# Patient Record
Sex: Female | Born: 1935 | Race: White | Hispanic: No | Smoking: Former smoker
Health system: Southern US, Community
[De-identification: ages and names within clinical notes are randomized; demographics above are authoritative.]

## PROBLEM LIST (undated history)

## (undated) DIAGNOSIS — D1724 Benign lipomatous neoplasm of skin and subcutaneous tissue of left leg: Secondary | ICD-10-CM

## (undated) DIAGNOSIS — F329 Major depressive disorder, single episode, unspecified: Secondary | ICD-10-CM

## (undated) DIAGNOSIS — S72009A Fracture of unspecified part of neck of unspecified femur, initial encounter for closed fracture: Secondary | ICD-10-CM

## (undated) DIAGNOSIS — T753XXA Motion sickness, initial encounter: Secondary | ICD-10-CM

## (undated) DIAGNOSIS — F32A Depression, unspecified: Secondary | ICD-10-CM

## (undated) DIAGNOSIS — K219 Gastro-esophageal reflux disease without esophagitis: Secondary | ICD-10-CM

## (undated) DIAGNOSIS — H25019 Cortical age-related cataract, unspecified eye: Secondary | ICD-10-CM

## (undated) DIAGNOSIS — R011 Cardiac murmur, unspecified: Secondary | ICD-10-CM

## (undated) DIAGNOSIS — M1990 Unspecified osteoarthritis, unspecified site: Secondary | ICD-10-CM

## (undated) DIAGNOSIS — E785 Hyperlipidemia, unspecified: Secondary | ICD-10-CM

## (undated) DIAGNOSIS — I10 Essential (primary) hypertension: Secondary | ICD-10-CM

## (undated) DIAGNOSIS — G5600 Carpal tunnel syndrome, unspecified upper limb: Secondary | ICD-10-CM

## (undated) DIAGNOSIS — C4490 Unspecified malignant neoplasm of skin, unspecified: Secondary | ICD-10-CM

## (undated) DIAGNOSIS — R232 Flushing: Secondary | ICD-10-CM

## (undated) DIAGNOSIS — M1711 Unilateral primary osteoarthritis, right knee: Secondary | ICD-10-CM

## (undated) DIAGNOSIS — M8580 Other specified disorders of bone density and structure, unspecified site: Secondary | ICD-10-CM

## (undated) DIAGNOSIS — J302 Other seasonal allergic rhinitis: Secondary | ICD-10-CM

## (undated) DIAGNOSIS — R32 Unspecified urinary incontinence: Secondary | ICD-10-CM

## (undated) HISTORY — PX: CHOLECYSTECTOMY: SHX55

## (undated) HISTORY — PX: COLONOSCOPY: SHX174

---

## 1989-05-05 HISTORY — PX: CHOLECYSTECTOMY: SHX55

## 2005-03-06 ENCOUNTER — Ambulatory Visit: Payer: Self-pay | Admitting: Certified Nurse Midwife

## 2007-02-23 ENCOUNTER — Ambulatory Visit: Payer: Self-pay

## 2009-09-07 ENCOUNTER — Ambulatory Visit: Payer: Self-pay | Admitting: Family Medicine

## 2009-09-15 ENCOUNTER — Ambulatory Visit: Payer: Self-pay | Admitting: Internal Medicine

## 2010-01-17 ENCOUNTER — Ambulatory Visit: Payer: Self-pay | Admitting: Otolaryngology

## 2010-05-15 ENCOUNTER — Ambulatory Visit: Payer: Self-pay | Admitting: Otolaryngology

## 2010-05-16 LAB — PATHOLOGY REPORT

## 2010-08-20 ENCOUNTER — Ambulatory Visit: Payer: Self-pay | Admitting: Unknown Physician Specialty

## 2011-07-18 ENCOUNTER — Ambulatory Visit: Payer: Self-pay | Admitting: Gastroenterology

## 2011-07-22 LAB — PATHOLOGY REPORT

## 2013-05-28 ENCOUNTER — Ambulatory Visit: Payer: Self-pay | Admitting: Physician Assistant

## 2013-05-28 LAB — RAPID INFLUENZA A&B ANTIGENS

## 2013-06-01 ENCOUNTER — Ambulatory Visit: Payer: Self-pay | Admitting: Family Medicine

## 2015-09-21 NOTE — Discharge Instructions (Signed)

## 2015-09-24 ENCOUNTER — Encounter: Payer: Self-pay | Admitting: *Deleted

## 2015-09-26 ENCOUNTER — Encounter: Payer: Self-pay | Admitting: *Deleted

## 2015-09-26 ENCOUNTER — Ambulatory Visit: Payer: Medicare Other | Admitting: Anesthesiology

## 2015-09-26 ENCOUNTER — Encounter
Admission: RE | Disposition: A | Discharge status: Home / Self Care | Payer: Self-pay | Source: Ambulatory Visit | Attending: Ophthalmology

## 2015-09-26 ENCOUNTER — Ambulatory Visit
Admission: RE | Admit: 2015-09-26 | Discharge: 2015-09-26 | Disposition: A | Discharge status: Home / Self Care | Payer: Medicare Other | Source: Ambulatory Visit | Attending: Ophthalmology | Admitting: Ophthalmology

## 2015-09-26 DIAGNOSIS — M170 Bilateral primary osteoarthritis of knee: Secondary | ICD-10-CM | POA: Diagnosis not present

## 2015-09-26 DIAGNOSIS — M19079 Primary osteoarthritis, unspecified ankle and foot: Secondary | ICD-10-CM | POA: Diagnosis not present

## 2015-09-26 DIAGNOSIS — Z87891 Personal history of nicotine dependence: Secondary | ICD-10-CM | POA: Insufficient documentation

## 2015-09-26 DIAGNOSIS — H2511 Age-related nuclear cataract, right eye: Secondary | ICD-10-CM | POA: Insufficient documentation

## 2015-09-26 DIAGNOSIS — R32 Unspecified urinary incontinence: Secondary | ICD-10-CM | POA: Insufficient documentation

## 2015-09-26 DIAGNOSIS — F329 Major depressive disorder, single episode, unspecified: Secondary | ICD-10-CM | POA: Diagnosis not present

## 2015-09-26 HISTORY — DX: Unspecified osteoarthritis, unspecified site: M19.90

## 2015-09-26 HISTORY — DX: Depression, unspecified: F32.A

## 2015-09-26 HISTORY — DX: Motion sickness, initial encounter: T75.3XXA

## 2015-09-26 HISTORY — DX: Major depressive disorder, single episode, unspecified: F32.9

## 2015-09-26 HISTORY — DX: Other seasonal allergic rhinitis: J30.2

## 2015-09-26 HISTORY — PX: CATARACT EXTRACTION W/PHACO: SHX586

## 2015-09-26 SURGERY — PHACOEMULSIFICATION, CATARACT, WITH IOL INSERTION
Anesthesia: Monitor Anesthesia Care | Site: Eye | Laterality: Right | Wound class: Clean

## 2015-09-26 MED ORDER — TIMOLOL MALEATE 0.5 % OP SOLN
OPHTHALMIC | Status: DC | PRN
  Administered 2015-09-26: 1 [drp]

## 2015-09-26 MED ORDER — BRIMONIDINE TARTRATE 0.2 % OP SOLN
OPHTHALMIC | Status: DC | PRN
  Administered 2015-09-26: 1 [drp]

## 2015-09-26 MED ORDER — LACTATED RINGERS IV SOLN: INTRAVENOUS | Status: DC

## 2015-09-26 MED ORDER — ARMC OPHTHALMIC DILATING GEL
1.0000 "application " | OPHTHALMIC | Status: DC | PRN
  Administered 2015-09-26 (×2): 1 via OPHTHALMIC

## 2015-09-26 MED ORDER — CEFUROXIME OPHTHALMIC INJECTION 1 MG/0.1 ML
INJECTION | OPHTHALMIC | Status: DC | PRN
  Administered 2015-09-26: 0.1 mL via OPHTHALMIC

## 2015-09-26 MED ORDER — BSS IO SOLN
INTRAOCULAR | Status: DC | PRN
  Administered 2015-09-26: 73 mL via OPHTHALMIC

## 2015-09-26 MED ORDER — POVIDONE-IODINE 5 % OP SOLN
1.0000 "application " | OPHTHALMIC | Status: DC | PRN
  Administered 2015-09-26: 1 via OPHTHALMIC

## 2015-09-26 MED ORDER — MIDAZOLAM HCL 2 MG/2ML IJ SOLN
INTRAMUSCULAR | Status: DC | PRN
  Administered 2015-09-26: 2 mg via INTRAVENOUS

## 2015-09-26 MED ORDER — NA HYALUR & NA CHOND-NA HYALUR 0.4-0.35 ML IO KIT
PACK | INTRAOCULAR | Status: DC | PRN
  Administered 2015-09-26: 1 mL via INTRAOCULAR

## 2015-09-26 MED ORDER — TETRACAINE HCL 0.5 % OP SOLN
1.0000 [drp] | OPHTHALMIC | Status: DC | PRN
  Administered 2015-09-26: 1 [drp] via OPHTHALMIC

## 2015-09-26 MED ORDER — FENTANYL CITRATE (PF) 100 MCG/2ML IJ SOLN
INTRAMUSCULAR | Status: DC | PRN
  Administered 2015-09-26: 50 ug via INTRAVENOUS

## 2015-09-26 MED ORDER — LIDOCAINE HCL (PF) 4 % IJ SOLN
INTRAOCULAR | Status: DC | PRN
  Administered 2015-09-26: 1 mL via OPHTHALMIC

## 2015-09-26 SURGICAL SUPPLY — 24 items
CANNULA ANT/CHMB 27GA (MISCELLANEOUS) ×3 IMPLANT
CARTRIDGE ABBOTT (MISCELLANEOUS) IMPLANT
GLOVE SURG LX 7.5 STRW (GLOVE) ×2
GLOVE SURG LX STRL 7.5 STRW (GLOVE) ×1 IMPLANT
GLOVE SURG TRIUMPH 8.0 PF LTX (GLOVE) ×3 IMPLANT
GOWN STRL REUS W/ TWL LRG LVL3 (GOWN DISPOSABLE) ×2 IMPLANT
GOWN STRL REUS W/TWL LRG LVL3 (GOWN DISPOSABLE) ×4
LENS IOL TECNIS ITEC 23.5 (Intraocular Lens) ×3 IMPLANT
MARKER SKIN DUAL TIP RULER LAB (MISCELLANEOUS) ×3 IMPLANT
NDL RETROBULBAR .5 NSTRL (NEEDLE) IMPLANT
NEEDLE FILTER BLUNT 18X 1/2SAF (NEEDLE) ×4
NEEDLE FILTER BLUNT 18X1 1/2 (NEEDLE) ×2 IMPLANT
PACK CATARACT BRASINGTON (MISCELLANEOUS) ×3 IMPLANT
PACK EYE AFTER SURG (MISCELLANEOUS) ×3 IMPLANT
PACK OPTHALMIC (MISCELLANEOUS) ×3 IMPLANT
RING MALYGIN 7.0 (MISCELLANEOUS) IMPLANT
SUT ETHILON 10-0 CS-B-6CS-B-6 (SUTURE)
SUT VICRYL  9 0 (SUTURE)
SUT VICRYL 9 0 (SUTURE) IMPLANT
SUTURE EHLN 10-0 CS-B-6CS-B-6 (SUTURE) IMPLANT
SYR 3ML LL SCALE MARK (SYRINGE) ×6 IMPLANT
SYR TB 1ML LUER SLIP (SYRINGE) ×3 IMPLANT
WATER STERILE IRR 250ML POUR (IV SOLUTION) ×3 IMPLANT
WIPE NON LINTING 3.25X3.25 (MISCELLANEOUS) ×3 IMPLANT

## 2015-09-26 NOTE — Anesthesia Postprocedure Evaluation (Signed)
Anesthesia Post Note  Patient: Cynthia Ritter  Procedure(s) Performed: Procedure(s) (LRB): CATARACT EXTRACTION PHACO AND INTRAOCULAR LENS PLACEMENT (IOC) (Right)  Patient location during evaluation: PACU Anesthesia Type: MAC Level of consciousness: awake and alert Pain management: pain level controlled Vital Signs Assessment: post-procedure vital signs reviewed and stable Respiratory status: spontaneous breathing, nonlabored ventilation, respiratory function stable and patient connected to nasal cannula oxygen Cardiovascular status: stable and blood pressure returned to baseline Anesthetic complications: no    Shiree Altemus C

## 2015-09-26 NOTE — Anesthesia Preprocedure Evaluation (Signed)
Anesthesia Evaluation  Patient identified by MRN, date of birth, ID band Patient awake    Reviewed: Allergy & Precautions, NPO status , Patient's Chart, lab work & pertinent test results  Airway Mallampati: II  TM Distance: >3 FB Neck ROM: Full    Dental no notable dental hx.    Pulmonary neg pulmonary ROS, former smoker,    Pulmonary exam normal breath sounds clear to auscultation       Cardiovascular negative cardio ROS Normal cardiovascular exam Rhythm:Regular Rate:Normal     Neuro/Psych Depression negative neurological ROS     GI/Hepatic negative GI ROS, Neg liver ROS,   Endo/Other  negative endocrine ROS  Renal/GU negative Renal ROS  negative genitourinary   Musculoskeletal  (+) Arthritis ,   Abdominal   Peds negative pediatric ROS (+)  Hematology negative hematology ROS (+)   Anesthesia Other Findings   Reproductive/Obstetrics negative OB ROS                             Anesthesia Physical Anesthesia Plan  ASA: II  Anesthesia Plan: MAC   Post-op Pain Management:    Induction: Intravenous  Airway Management Planned:   Additional Equipment:   Intra-op Plan:   Post-operative Plan: Extubation in OR  Informed Consent: I have reviewed the patients History and Physical, chart, labs and discussed the procedure including the risks, benefits and alternatives for the proposed anesthesia with the patient or authorized representative who has indicated his/her understanding and acceptance.   Dental advisory given  Plan Discussed with: CRNA  Anesthesia Plan Comments:         Anesthesia Quick Evaluation

## 2015-09-26 NOTE — Transfer of Care (Signed)
Immediate Anesthesia Transfer of Care Note  Patient: Cynthia Ritter  Procedure(s) Performed: Procedure(s): CATARACT EXTRACTION PHACO AND INTRAOCULAR LENS PLACEMENT (IOC) (Right)  Patient Location: PACU  Anesthesia Type: MAC  Level of Consciousness: awake, alert  and patient cooperative  Airway and Oxygen Therapy: Patient Spontanous Breathing and Patient connected to supplemental oxygen  Post-op Assessment: Post-op Vital signs reviewed, Patient's Cardiovascular Status Stable, Respiratory Function Stable, Patent Airway and No signs of Nausea or vomiting  Post-op Vital Signs: Reviewed and stable  Complications: No apparent anesthesia complications

## 2015-09-26 NOTE — Op Note (Signed)
LOCATION:  [place]   PREOPERATIVE DIAGNOSIS:    Nuclear sclerotic cataract right eye. H25.11   POSTOPERATIVE DIAGNOSIS:  Nuclear sclerotic cataract right eye.     PROCEDURE:  Phacoemusification with posterior chamber intraocular lens placement of the right eye   LENS:   Implant Name Type Inv. Item Serial No. Manufacturer Lot No. LRB No. Used  LENS IOL DIOP 23.5 - [id number] Intraocular Lens LENS IOL DIOP 23.5 [id number]   Right 1        ULTRASOUND TIME: 20 % of 1 minutes, 18 seconds.  CDE 16.2   SURGEON:  Wyonia Hough, MD   ANESTHESIA:  Topical with tetracaine drops and 2% Xylocaine jelly, augmented with 1% preservative-free intracameral lidocaine.    COMPLICATIONS:  None.   DESCRIPTION OF PROCEDURE:  The patient was identified in the holding room and transported to the operating room and placed in the supine position under the operating microscope.  The right eye was identified as the operative eye and it was prepped and draped in the usual sterile ophthalmic fashion.   A 1 millimeter clear-corneal paracentesis was made at the 12:00 position.  0.5 ml of preservative-free 1% lidocaine was injected into the anterior chamber. The anterior chamber was filled with Viscoat viscoelastic.  A 2.4 millimeter keratome was used to make a near-clear corneal incision at the 9:00 position.  A curvilinear capsulorrhexis was made with a cystotome and capsulorrhexis forceps.  Balanced salt solution was used to hydrodissect and hydrodelineate the nucleus.   Phacoemulsification was then used in stop and chop fashion to remove the lens nucleus and epinucleus.  The remaining cortex was then removed using the irrigation and aspiration handpiece. Provisc was then placed into the capsular bag to distend it for lens placement.  A lens was then injected into the capsular bag.  The remaining viscoelastic was aspirated.   Wounds were hydrated with balanced salt solution.  The anterior  chamber was inflated to a physiologic pressure with balanced salt solution.  No wound leaks were noted. Cefuroxime 0.1 ml of a 10mg /ml solution was injected into the anterior chamber for a dose of 1 mg of intracameral antibiotic at the completion of the case.   Timolol and Brimonidine drops were applied to the eye.  The patient was taken to the recovery room in stable condition without complications of anesthesia or surgery.   Lataysha Vohra 09/26/2015, 12:05 PM

## 2015-09-26 NOTE — Anesthesia Procedure Notes (Signed)
Procedure Name: MAC Performed by: Steven Veazie Pre-anesthesia Checklist: Patient identified, Emergency Drugs available, Suction available, Timeout performed and Patient being monitored Patient Re-evaluated:Patient Re-evaluated prior to inductionOxygen Delivery Method: Nasal cannula Placement Confirmation: positive ETCO2       

## 2015-09-26 NOTE — H&P (Signed)
  The History and Physical notes are on paper, have been signed, and are to be scanned. The patient remains stable and unchanged from the H&P.   Previous H&P reviewed, patient examined, and there are no changes.  Cynthia Ritter 09/26/2015 10:57 AM

## 2015-09-27 ENCOUNTER — Encounter: Payer: Self-pay | Admitting: Ophthalmology

## 2015-10-22 NOTE — Discharge Instructions (Signed)

## 2015-10-24 ENCOUNTER — Ambulatory Visit
Admission: RE | Admit: 2015-10-24 | Discharge: 2015-10-24 | Disposition: A | Discharge status: Home / Self Care | Payer: Medicare Other | Source: Ambulatory Visit | Attending: Ophthalmology | Admitting: Ophthalmology

## 2015-10-24 ENCOUNTER — Ambulatory Visit: Payer: Medicare Other | Admitting: Anesthesiology

## 2015-10-24 ENCOUNTER — Encounter
Admission: RE | Disposition: A | Discharge status: Home / Self Care | Payer: Self-pay | Source: Ambulatory Visit | Attending: Ophthalmology

## 2015-10-24 DIAGNOSIS — Z87891 Personal history of nicotine dependence: Secondary | ICD-10-CM | POA: Insufficient documentation

## 2015-10-24 DIAGNOSIS — M19079 Primary osteoarthritis, unspecified ankle and foot: Secondary | ICD-10-CM | POA: Diagnosis not present

## 2015-10-24 DIAGNOSIS — Z9841 Cataract extraction status, right eye: Secondary | ICD-10-CM | POA: Insufficient documentation

## 2015-10-24 DIAGNOSIS — R32 Unspecified urinary incontinence: Secondary | ICD-10-CM | POA: Insufficient documentation

## 2015-10-24 DIAGNOSIS — M170 Bilateral primary osteoarthritis of knee: Secondary | ICD-10-CM | POA: Insufficient documentation

## 2015-10-24 DIAGNOSIS — H2512 Age-related nuclear cataract, left eye: Secondary | ICD-10-CM | POA: Diagnosis not present

## 2015-10-24 DIAGNOSIS — Z9049 Acquired absence of other specified parts of digestive tract: Secondary | ICD-10-CM | POA: Diagnosis not present

## 2015-10-24 DIAGNOSIS — F329 Major depressive disorder, single episode, unspecified: Secondary | ICD-10-CM | POA: Diagnosis not present

## 2015-10-24 HISTORY — PX: CATARACT EXTRACTION W/PHACO: SHX586

## 2015-10-24 HISTORY — DX: Unspecified urinary incontinence: R32

## 2015-10-24 HISTORY — DX: Flushing: R23.2

## 2015-10-24 SURGERY — PHACOEMULSIFICATION, CATARACT, WITH IOL INSERTION
Anesthesia: Monitor Anesthesia Care | Site: Eye | Laterality: Left | Wound class: Clean

## 2015-10-24 MED ORDER — POVIDONE-IODINE 5 % OP SOLN
1.0000 "application " | OPHTHALMIC | Status: DC | PRN
  Administered 2015-10-24: 1 via OPHTHALMIC

## 2015-10-24 MED ORDER — BALANCED SALT IO SOLN
INTRAOCULAR | Status: DC | PRN
  Administered 2015-10-24: 1 mL via OPHTHALMIC

## 2015-10-24 MED ORDER — BRIMONIDINE TARTRATE 0.2 % OP SOLN
OPHTHALMIC | Status: DC | PRN
  Administered 2015-10-24: 1 [drp] via OPHTHALMIC

## 2015-10-24 MED ORDER — FENTANYL CITRATE (PF) 100 MCG/2ML IJ SOLN
INTRAMUSCULAR | Status: DC | PRN
  Administered 2015-10-24: 50 ug via INTRAVENOUS

## 2015-10-24 MED ORDER — MIDAZOLAM HCL 2 MG/2ML IJ SOLN
INTRAMUSCULAR | Status: DC | PRN
  Administered 2015-10-24: 2 mg via INTRAVENOUS

## 2015-10-24 MED ORDER — EPINEPHRINE HCL 1 MG/ML IJ SOLN
INTRAMUSCULAR | Status: DC | PRN
  Administered 2015-10-24: 77 mL via OPHTHALMIC

## 2015-10-24 MED ORDER — ARMC OPHTHALMIC DILATING GEL
1.0000 "application " | OPHTHALMIC | Status: DC | PRN
  Administered 2015-10-24 (×2): 1 via OPHTHALMIC

## 2015-10-24 MED ORDER — TIMOLOL MALEATE 0.5 % OP SOLN
OPHTHALMIC | Status: DC | PRN
  Administered 2015-10-24: 1 [drp] via OPHTHALMIC

## 2015-10-24 MED ORDER — NA HYALUR & NA CHOND-NA HYALUR 0.4-0.35 ML IO KIT
PACK | INTRAOCULAR | Status: DC | PRN
  Administered 2015-10-24: 1 mL via INTRAOCULAR

## 2015-10-24 MED ORDER — TETRACAINE HCL 0.5 % OP SOLN
1.0000 [drp] | OPHTHALMIC | Status: DC | PRN
  Administered 2015-10-24: 1 [drp] via OPHTHALMIC

## 2015-10-24 MED ORDER — CEFUROXIME OPHTHALMIC INJECTION 1 MG/0.1 ML
INJECTION | OPHTHALMIC | Status: DC | PRN
  Administered 2015-10-24: 0.1 mL via INTRACAMERAL

## 2015-10-24 SURGICAL SUPPLY — 25 items
CANNULA ANT/CHMB 27GA (MISCELLANEOUS) ×3 IMPLANT
CARTRIDGE ABBOTT (MISCELLANEOUS) IMPLANT
GLOVE SURG LX 7.5 STRW (GLOVE) ×2
GLOVE SURG LX STRL 7.5 STRW (GLOVE) ×1 IMPLANT
GLOVE SURG TRIUMPH 8.0 PF LTX (GLOVE) ×3 IMPLANT
GOWN STRL REUS W/ TWL LRG LVL3 (GOWN DISPOSABLE) ×2 IMPLANT
GOWN STRL REUS W/TWL LRG LVL3 (GOWN DISPOSABLE) ×4
LENS IOL TECNIS ITEC 23.0 (Intraocular Lens) ×3 IMPLANT
MARKER SKIN DUAL TIP RULER LAB (MISCELLANEOUS) ×3 IMPLANT
NDL RETROBULBAR .5 NSTRL (NEEDLE) IMPLANT
NEEDLE FILTER BLUNT 18X 1/2SAF (NEEDLE) ×2
NEEDLE FILTER BLUNT 18X1 1/2 (NEEDLE) ×1 IMPLANT
PACK CATARACT BRASINGTON (MISCELLANEOUS) ×3 IMPLANT
PACK EYE AFTER SURG (MISCELLANEOUS) ×3 IMPLANT
PACK OPTHALMIC (MISCELLANEOUS) ×3 IMPLANT
RING MALYGIN 7.0 (MISCELLANEOUS) IMPLANT
SUT ETHILON 10-0 CS-B-6CS-B-6 (SUTURE)
SUT VICRYL  9 0 (SUTURE)
SUT VICRYL 9 0 (SUTURE) IMPLANT
SUTURE EHLN 10-0 CS-B-6CS-B-6 (SUTURE) IMPLANT
SYR 3ML LL SCALE MARK (SYRINGE) ×3 IMPLANT
SYR 5ML LL (SYRINGE) ×3 IMPLANT
SYR TB 1ML LUER SLIP (SYRINGE) ×3 IMPLANT
WATER STERILE IRR 250ML POUR (IV SOLUTION) ×3 IMPLANT
WIPE NON LINTING 3.25X3.25 (MISCELLANEOUS) ×3 IMPLANT

## 2015-10-24 NOTE — Transfer of Care (Signed)
Immediate Anesthesia Transfer of Care Note  Patient: Cynthia Ritter  Procedure(s) Performed: Procedure(s): CATARACT EXTRACTION PHACO AND INTRAOCULAR LENS PLACEMENT (IOC) left eye (Left)  Patient Location: PACU  Anesthesia Type: MAC  Level of Consciousness: awake, alert  and patient cooperative  Airway and Oxygen Therapy: Patient Spontanous Breathing and Patient connected to supplemental oxygen  Post-op Assessment: Post-op Vital signs reviewed, Patient's Cardiovascular Status Stable, Respiratory Function Stable, Patent Airway and No signs of Nausea or vomiting  Post-op Vital Signs: Reviewed and stable  Complications: No apparent anesthesia complications

## 2015-10-24 NOTE — Anesthesia Postprocedure Evaluation (Signed)
Anesthesia Post Note  Patient: Cynthia Ritter  Procedure(s) Performed: Procedure(s) (LRB): CATARACT EXTRACTION PHACO AND INTRAOCULAR LENS PLACEMENT (IOC) left eye (Left)  Patient location during evaluation: PACU Anesthesia Type: MAC Level of consciousness: awake and alert and oriented Pain management: pain level controlled Vital Signs Assessment: post-procedure vital signs reviewed and stable Respiratory status: spontaneous breathing and nonlabored ventilation Cardiovascular status: stable Postop Assessment: no signs of nausea or vomiting and adequate PO intake Anesthetic complications: no    Estill Batten

## 2015-10-24 NOTE — Anesthesia Preprocedure Evaluation (Signed)
Anesthesia Evaluation  Patient identified by MRN, date of birth, ID band Patient awake    Reviewed: Allergy & Precautions, NPO status , Patient's Chart, lab work & pertinent test results  Airway Mallampati: II  TM Distance: >3 FB Neck ROM: Full    Dental no notable dental hx.    Pulmonary neg pulmonary ROS, former smoker,    Pulmonary exam normal breath sounds clear to auscultation       Cardiovascular negative cardio ROS Normal cardiovascular exam Rhythm:Regular Rate:Normal     Neuro/Psych Depression negative neurological ROS     GI/Hepatic negative GI ROS, Neg liver ROS,   Endo/Other  negative endocrine ROS  Renal/GU negative Renal ROS  negative genitourinary   Musculoskeletal  (+) Arthritis ,   Abdominal   Peds negative pediatric ROS (+)  Hematology negative hematology ROS (+)   Anesthesia Other Findings   Reproductive/Obstetrics negative OB ROS                             Anesthesia Physical  Anesthesia Plan  ASA: II  Anesthesia Plan: MAC   Post-op Pain Management:    Induction: Intravenous  Airway Management Planned:   Additional Equipment:   Intra-op Plan:   Post-operative Plan: Extubation in OR  Informed Consent: I have reviewed the patients History and Physical, chart, labs and discussed the procedure including the risks, benefits and alternatives for the proposed anesthesia with the patient or authorized representative who has indicated his/her understanding and acceptance.   Dental advisory given  Plan Discussed with: CRNA  Anesthesia Plan Comments:         Anesthesia Quick Evaluation

## 2015-10-24 NOTE — H&P (Signed)
  The History and Physical notes are on paper, have been signed, and are to be scanned. The patient remains stable and unchanged from the H&P.   Previous H&P reviewed, patient examined, and there are no changes.  Mykelle Cockerell 10/24/2015 9:55 AM

## 2015-10-24 NOTE — Op Note (Signed)
OPERATIVE NOTE  SHYONNA KNUPPEL [id number] 10/24/2015   PREOPERATIVE DIAGNOSIS:  Nuclear sclerotic cataract left eye. H25.12   POSTOPERATIVE DIAGNOSIS:    Nuclear sclerotic cataract left eye.     PROCEDURE:  Phacoemusification with posterior chamber intraocular lens placement of the left eye   LENS:   Implant Name Type Inv. Item Serial No. Manufacturer Lot No. LRB No. Used  LENS IOL DIOP 23.0 - [id number] Intraocular Lens LENS IOL DIOP 23.0 [id number]   Left 1        ULTRASOUND TIME: 14  % of 1 minutes 33 seconds, CDE 12.7  SURGEON:  Wyonia Hough, MD   ANESTHESIA:  Topical with tetracaine drops and 2% Xylocaine jelly, augmented with 1% preservative-free intracameral lidocaine.    COMPLICATIONS:  None.   DESCRIPTION OF PROCEDURE:  The patient was identified in the holding room and transported to the operating room and placed in the supine position under the operating microscope.  The left eye was identified as the operative eye and it was prepped and draped in the usual sterile ophthalmic fashion.   A 1 millimeter clear-corneal paracentesis was made at the 1:30 position.  0.5 ml of preservative-free 1% lidocaine was injected into the anterior chamber.  The anterior chamber was filled with Viscoat viscoelastic.  A 2.4 millimeter keratome was used to make a near-clear corneal incision at the 10:30 position.  .  A curvilinear capsulorrhexis was made with a cystotome and capsulorrhexis forceps.  Balanced salt solution was used to hydrodissect and hydrodelineate the nucleus.   Phacoemulsification was then used in stop and chop fashion to remove the lens nucleus and epinucleus.  The remaining cortex was then removed using the irrigation and aspiration handpiece. Provisc was then placed into the capsular bag to distend it for lens placement.  A lens was then injected into the capsular bag.  The remaining viscoelastic was aspirated.   Wounds were hydrated with balanced salt  solution.  The anterior chamber was inflated to a physiologic pressure with balanced salt solution.  No wound leaks were noted. Cefuroxime 0.1 ml of a 10mg /ml solution was injected into the anterior chamber for a dose of 1 mg of intracameral antibiotic at the completion of the case.   Timolol and Brimonidine drops were applied to the eye.  The patient was taken to the recovery room in stable condition without complications of anesthesia or surgery.  Jaicey Sweaney 10/24/2015, 11:16 AM

## 2015-10-24 NOTE — Anesthesia Procedure Notes (Signed)
Procedure Name: MAC Performed by: Adryan Shin Pre-anesthesia Checklist: Patient identified, Emergency Drugs available, Suction available, Timeout performed and Patient being monitored Patient Re-evaluated:Patient Re-evaluated prior to inductionOxygen Delivery Method: Nasal cannula Placement Confirmation: positive ETCO2     

## 2015-10-25 ENCOUNTER — Encounter: Payer: Self-pay | Admitting: Ophthalmology

## 2016-09-19 ENCOUNTER — Encounter: Payer: Self-pay | Admitting: Emergency Medicine

## 2016-09-19 ENCOUNTER — Ambulatory Visit
Admission: EM | Admit: 2016-09-19 | Discharge: 2016-09-19 | Disposition: A | Discharge status: Home / Self Care | Payer: Medicare Other | Attending: Family Medicine | Admitting: Family Medicine

## 2016-09-19 DIAGNOSIS — J069 Acute upper respiratory infection, unspecified: Secondary | ICD-10-CM

## 2016-09-19 MED ORDER — HYDROCOD POLST-CPM POLST ER 10-8 MG/5ML PO SUER: 2.5000 mL | Freq: Two times a day (BID) | ORAL | 0 refills | Status: DC

## 2016-09-19 MED ORDER — ALBUTEROL SULFATE HFA 108 (90 BASE) MCG/ACT IN AERS: 1.0000 | INHALATION_SPRAY | Freq: Four times a day (QID) | RESPIRATORY_TRACT | 0 refills | Status: DC | PRN

## 2016-09-19 MED ORDER — BENZONATATE 200 MG PO CAPS: ORAL_CAPSULE | ORAL | 0 refills | Status: DC

## 2016-09-19 MED ORDER — FLUTICASONE PROPIONATE 50 MCG/ACT NA SUSP: 2.0000 | Freq: Every day | NASAL | 0 refills | Status: DC

## 2016-09-19 NOTE — ED Provider Notes (Signed)
CSN: [id number]     Arrival date & time 09/19/16  1005 History   First MD Initiated Contact with Patient 09/19/16 1101     Chief Complaint  Patient presents with  . Cough   (Consider location/radiation/quality/duration/timing/severity/associated sxs/prior Treatment) HPI  This 83 year old female who presents with cough congestion and wheezing for the last 2 days. Not had any fever although today she is 99.1. Complaint of mild shortness of breath. O2 sats on room air 98%. She states over-the-counter medications have not been effective.        Past Medical History:  Diagnosis Date  . Arthritis    left heel/ knees and ankle, wears a knee brace occasionally  . Depression   . Hot flashes   . Incontinence    URINARY  . Motion sickness    planes, back seat-cars  . Seasonal allergies    Past Surgical History:  Procedure Laterality Date  . CATARACT EXTRACTION W/PHACO Right 09/26/2015   Procedure: CATARACT EXTRACTION PHACO AND INTRAOCULAR LENS PLACEMENT (IOC);  Surgeon: Leandrew Koyanagi, MD;  Location: [place];  Service: Ophthalmology;  Laterality: Right;  . CATARACT EXTRACTION W/PHACO Left 10/24/2015   Procedure: CATARACT EXTRACTION PHACO AND INTRAOCULAR LENS PLACEMENT ([place]) left eye;  Surgeon: Leandrew Koyanagi, MD;  Location: [place];  Service: Ophthalmology;  Laterality: Left;  . CHOLECYSTECTOMY    . COLONOSCOPY     X2   Family History  Problem Relation Age of Onset  . Heart failure Mother   . Hypertension Mother   . Asthma Mother   . Parkinson's disease Mother   . Diabetes Mother   . Heart failure Father   . Cirrhosis Sister    Social History  Substance Use Topics  . Smoking status: Former Research scientist (life sciences)  . Smokeless tobacco: Never Used     Comment: smoked in college  . Alcohol use Yes     Comment: may drink 1x/month   OB History    No data available     Review of Systems  Constitutional: Positive for activity change. Negative for chills,  fatigue and fever.  HENT: Positive for postnasal drip.   Respiratory: Positive for cough, shortness of breath and wheezing. Negative for stridor.   All other systems reviewed and are negative.   Allergies  Patient has no known allergies.  Home Medications   Prior to Admission medications   Medication Sig Start Date End Date Taking? Authorizing Provider  Probiotic Product (PROBIOTIC-10 PO) Take by mouth.   Yes [provider]  albuterol (PROVENTIL HFA;VENTOLIN HFA) 108 (90 Base) MCG/ACT inhaler Inhale 1-2 puffs into the lungs every 6 (six) hours as needed for wheezing or shortness of breath. Use with spacer 09/19/16   Lorin Picket, PA-C  benzonatate (TESSALON) 200 MG capsule Take one cap TID PRN cough 09/19/16   Lorin Picket, PA-C  chlorpheniramine-HYDROcodone [facility]) 10-8 MG/5ML SUER Take 2.5 mLs by mouth 2 (two) times daily. 09/19/16   Lorin Picket, PA-C  FLUoxetine (PROZAC) 20 MG tablet Take 30 mg by mouth daily. am    [provider]  fluticasone (FLONASE) 50 MCG/ACT nasal spray Place 2 sprays into both nostrils daily. 09/19/16   Lorin Picket, PA-C  tolterodine (DETROL LA) 4 MG 24 hr capsule Take 4 mg by mouth daily. am    [provider]   Meds Ordered and Administered this Visit  Medications - No data to display  BP (!) 170/92 (BP Location: Right Arm)  Pulse 73   Temp 99.1 F (37.3 C) (Oral)   Resp 18   Ht 5\' 5"  (1.651 m)   Wt 210 lb (95.3 kg)   SpO2 98%   BMI 34.95 kg/m  No data found.   Physical Exam  Constitutional: She is oriented to person, place, and time. She appears well-developed and well-nourished. No distress.  HENT:  Head: Normocephalic.  Right Ear: External ear normal.  Left Ear: External ear normal.  Nose: Nose normal.  Mouth/Throat: Oropharynx is clear and moist. No oropharyngeal exudate.  Eyes: Pupils are equal, round, and reactive to light. Right eye exhibits no discharge. Left eye  exhibits no discharge.  Neck: Normal range of motion. Neck supple.  Pulmonary/Chest: Effort normal. No respiratory distress. She has wheezes. She has no rales. She exhibits no tenderness.  Wheezes are scant and occasional.  Musculoskeletal: Normal range of motion.  Lymphadenopathy:    She has no cervical adenopathy.  Neurological: She is alert and oriented to person, place, and time.  Skin: Skin is warm and dry. She is not diaphoretic.  Psychiatric: She has a normal mood and affect. Her behavior is normal. Judgment and thought content normal.  Nursing note and vitals reviewed.   Urgent Care Course     Procedures (including critical care time)  Labs Review Labs Reviewed - No data to display  Imaging Review No results found.   Visual Acuity Review  Right Eye Distance:   Left Eye Distance:   Bilateral Distance:    Right Eye Near:   Left Eye Near:    Bilateral Near:         MDM   1. Acute upper respiratory infection    Discharge Medication List as of 09/19/2016 11:28 AM    START taking these medications   Details  albuterol (PROVENTIL HFA;VENTOLIN HFA) 108 (90 Base) MCG/ACT inhaler Inhale 1-2 puffs into the lungs every 6 (six) hours as needed for wheezing or shortness of breath. Use with spacer, Starting Fri 09/19/2016, Normal    benzonatate (TESSALON) 200 MG capsule Take one cap TID PRN cough, Normal    chlorpheniramine-HYDROcodone (TUSSIONEX PENNKINETIC ER) 10-8 MG/5ML SUER Take 2.5 mLs by mouth 2 (two) times daily., Starting Fri 09/19/2016, Print    fluticasone (FLONASE) 50 MCG/ACT nasal spray Place 2 sprays into both nostrils daily., Starting Fri 09/19/2016, Normal      Plan: 1. Test/x-ray results and diagnosis reviewed with patient 2. rx as per orders; risks, benefits, potential side effects reviewed with patient 3. Recommend supportive treatment with Fluids and rest. Patient will use the Tessalon Perles for daytime use and Tussionex for nighttime. She was  cautioned regarding the use of Tussionex with activities requiring concentration and judgment and not to drive while taking it. Because her age decreased the dosage by one half. 4. F/u prn if symptoms worsen or don't improve     Lorin Picket, PA-C 09/19/16 2208

## 2016-09-19 NOTE — ED Triage Notes (Signed)
Pt reports cough, congestion, and wheezing ongoing over last 2 days

## 2016-10-20 ENCOUNTER — Ambulatory Visit
Admission: EM | Admit: 2016-10-20 | Discharge: 2016-10-20 | Disposition: A | Discharge status: Home / Self Care | Payer: Medicare Other | Attending: Emergency Medicine | Admitting: Emergency Medicine

## 2016-10-20 DIAGNOSIS — W228XXA Striking against or struck by other objects, initial encounter: Secondary | ICD-10-CM

## 2016-10-20 DIAGNOSIS — S0181XA Laceration without foreign body of other part of head, initial encounter: Secondary | ICD-10-CM

## 2016-10-20 NOTE — ED Triage Notes (Signed)
Patient states that she fell out of bed this morning around 5am. Patient states that she fell and hit a table above her right eye. Patient states that area bled a lot but has since stopped.

## 2016-10-20 NOTE — ED Provider Notes (Signed)
HPI  SUBJECTIVE:  Cynthia Ritter is a 83 y.o. female who presents with a laceration superior to her right eyebrow. States that she rolled out of bed and hit her face on the corner of a bedside table. She denies nausea, vomiting, loss of consciousness, neck pain, visual changes, coordination. She reports a right-sided headache. She tried to water, ice, applying pressure. The ice health. No aggravating factors. Past medical history of urinary incontinence and depression. No history of anticoagulant or antiplatelet use, atrial fibrillation, syncope, diabetes, hypertension. RSW:NIOEVO, Victoriano Lain, NP     Past Medical History:  Diagnosis Date  . Arthritis    left heel/ knees and ankle, wears a knee brace occasionally  . Depression   . Hot flashes   . Incontinence    URINARY  . Motion sickness    planes, back seat-cars  . Seasonal allergies     Past Surgical History:  Procedure Laterality Date  . CATARACT EXTRACTION W/PHACO Right 09/26/2015   Procedure: CATARACT EXTRACTION PHACO AND INTRAOCULAR LENS PLACEMENT (IOC);  Surgeon: Leandrew Koyanagi, MD;  Location: [place];  Service: Ophthalmology;  Laterality: Right;  . CATARACT EXTRACTION W/PHACO Left 10/24/2015   Procedure: CATARACT EXTRACTION PHACO AND INTRAOCULAR LENS PLACEMENT ([place]) left eye;  Surgeon: Leandrew Koyanagi, MD;  Location: [place];  Service: Ophthalmology;  Laterality: Left;  . CHOLECYSTECTOMY    . COLONOSCOPY     X2    Family History  Problem Relation Age of Onset  . Heart failure Mother   . Hypertension Mother   . Asthma Mother   . Parkinson's disease Mother   . Diabetes Mother   . Heart failure Father   . Cirrhosis Sister     Social History  Substance Use Topics  . Smoking status: Former Research scientist (life sciences)  . Smokeless tobacco: Never Used     Comment: smoked in college  . Alcohol use Yes     Comment: may drink 1x/month    No current facility-administered medications for this encounter.     Current Outpatient Prescriptions:  .  FLUoxetine (PROZAC) 20 MG tablet, Take 30 mg by mouth daily. am, Disp: , Rfl:  .  fluticasone (FLONASE) 50 MCG/ACT nasal spray, Place 2 sprays into both nostrils daily., Disp: 16 g, Rfl: 0 .  Probiotic Product (PROBIOTIC-10 PO), Take by mouth., Disp: , Rfl:  .  tolterodine (DETROL LA) 4 MG 24 hr capsule, Take 4 mg by mouth daily. am, Disp: , Rfl:   No Known Allergies   ROS  As noted in HPI.   Physical Exam  BP (!) 143/69 (BP Location: Left Arm)   Pulse 69   Temp 98.9 F (37.2 C) (Oral)   Resp 16   Ht 5\' 5"  (1.651 m)   Wt 210 lb (95.3 kg)   SpO2 99%   BMI 34.95 kg/m   Constitutional: Well developed, well nourished, no acute distress Eyes: PERRL, EOMI, conjunctiva normal bilaterally HENT: Normocephalic, 3 cm deep laceration superior to the right eyebrow extending to the bone. No periorbital tenderness, crepitus. No foreign body visualized. No periorbital ecchymosis.    Respiratory: Normal inspiratory effort Cardiovascular: Normal rate  GI:  nondistended,  skin: See picture Musculoskeletal: moving All extremities equally Neurologic: Alert & oriented x 3, patient able to raise and knit eyebrows together, CN II-XII grossly intact, no motor deficits, sensation grossly intact Psychiatric: Speech and behavior appropriate   ED Course   Medications - No data to display  No orders of the defined  types were placed in this encounter.  No results found for this or any previous visit (from the past 24 hour(s)). No results found.  ED Clinical Impression  Facial laceration, initial encounter   ED Assessment/Plan  No evidence of orbital fracture. Do not think the patient needs CT at this time. Doubt significant intracranial injury.  Procedure note. Irrigated wound out with 20 cc of normal saline. Then clean the area with chlorhexidine. Used 1.5 cc of lidocaine 1% with epi for local anesthesia. Then placed 2 deep 5-0 Vicryl mattress  sutures, then 7 5-0 Ethilon simple interrupted sutures with good approximation of the edges. Bacitracin and dressing applied. Patient tolerated procedure well.  Return to clinic in 10 days for suture removal, return sooner for any signs of infection.  Discussed MDM, plan and followup with patient.. gave written instructions on when to go to the ER Patient agrees with plan.   No orders of the defined types were placed in this encounter.   *This clinic note was created using Dragon dictation software. Therefore, there may be occasional mistakes despite careful proofreading.  ?   Melynda Ripple, MD 10/20/16 1005

## 2016-10-20 NOTE — Discharge Instructions (Signed)
Keep this covered for the next 48-72 hours. Then keep it dry. He may return here in 7-10 days for suture removal, sooner for any signs of infection. Go to the ER for severe headache, nausea, vomiting, visual changes, trouble talking, trouble walking, trouble with her coordination, fevers above 100.4 or other concerns

## 2016-10-30 ENCOUNTER — Ambulatory Visit
Admission: EM | Admit: 2016-10-30 | Discharge: 2016-10-30 | Disposition: A | Discharge status: Home / Self Care | Payer: Medicare Other

## 2016-10-30 DIAGNOSIS — Z4802 Encounter for removal of sutures: Secondary | ICD-10-CM

## 2016-10-30 NOTE — ED Triage Notes (Signed)
Pt here for suture removal from facial laceration. Site appears well healed.

## 2020-03-21 ENCOUNTER — Ambulatory Visit
Admission: EM | Admit: 2020-03-21 | Discharge: 2020-03-21 | Disposition: A | Discharge status: Home / Self Care | Payer: Medicare Other

## 2020-03-21 ENCOUNTER — Encounter: Payer: Self-pay | Admitting: Emergency Medicine

## 2020-03-21 ENCOUNTER — Other Ambulatory Visit: Payer: Self-pay

## 2020-03-21 DIAGNOSIS — B370 Candidal stomatitis: Secondary | ICD-10-CM | POA: Diagnosis not present

## 2020-03-21 HISTORY — DX: Essential (primary) hypertension: I10

## 2020-03-21 MED ORDER — NYSTATIN 100000 UNIT/ML MT SUSP: OROMUCOSAL | 0 refills | Status: AC

## 2020-03-21 NOTE — ED Triage Notes (Signed)
Patient in today c/o mouth lesions x 3 days. Patient has used warm salt water gargles and rinsed her mouth with Listerine.

## 2020-03-21 NOTE — ED Provider Notes (Signed)
[facility]    CSN: [id number] Arrival date & time: 03/21/20  1412      History   Chief Complaint Chief Complaint  Patient presents with  . Mouth Lesions    HPI Cynthia Ritter is a 86 y.o. female pending for white material inside the cheeks, lower lip, and posterior pharynx for the past 3 days.  She states that she looked online and believes she may have thrush.  Patient says she is used warm water gargles with salt and rinse her mouth of Listerine as well as use Chloraseptic spray.  She says that her sore throat has gotten better and her throat is actually not sore anymore.  Patient states that she recently had some dental work done before the symptom onset.  She also states that she just started taking gabapentin about 2 weeks ago for her carpal tunnel.  Patient denies any associated fever, fatigue, cough, congestion, swallowing difficulty or breathing problems.  Patient denies diabetes, use of corticosteroid inhalers or corticosteroids.  Not immunocompromised.  Patient has no other complaints or concerns today.  HPI  Past Medical History:  Diagnosis Date  . Arthritis    left heel/ knees and ankle, wears a knee brace occasionally  . Depression   . Hot flashes   . Hypertension   . Incontinence    URINARY  . Motion sickness    planes, back seat-cars  . Seasonal allergies     There are no problems to display for this patient.   Past Surgical History:  Procedure Laterality Date  . CATARACT EXTRACTION W/PHACO Right 09/26/2015   Procedure: CATARACT EXTRACTION PHACO AND INTRAOCULAR LENS PLACEMENT (IOC);  Surgeon: Leandrew Koyanagi, MD;  Location: [place];  Service: Ophthalmology;  Laterality: Right;  . CATARACT EXTRACTION W/PHACO Left 10/24/2015   Procedure: CATARACT EXTRACTION PHACO AND INTRAOCULAR LENS PLACEMENT ([place]) left eye;  Surgeon: Leandrew Koyanagi, MD;  Location: [place];  Service: Ophthalmology;  Laterality: Left;  .  CHOLECYSTECTOMY    . COLONOSCOPY     X2    OB History   No obstetric history on file.      Home Medications    Prior to Admission medications   Medication Sig Start Date End Date Taking? Authorizing Provider  cyanocobalamin 1000 MCG tablet Take 1 tablet by mouth every other day.   Yes [provider]  diclofenac Sodium (VOLTAREN) 1 % GEL Apply topically 2 (two) times daily. 05/11/18  Yes [provider]  FLUoxetine (PROZAC) 20 MG tablet Take 30 mg by mouth daily. am   Yes [provider]  gabapentin (NEURONTIN) 100 MG capsule SMARTSIG:0-2 By Mouth Twice Daily 03/17/20  Yes [provider]  losartan (COZAAR) 25 MG tablet Take 25 mg by mouth daily. 01/14/20  Yes [provider]  MYRBETRIQ 25 MG TB24 tablet Take 25 mg by mouth daily. 01/15/20  Yes [provider]  tolterodine (DETROL LA) 4 MG 24 hr capsule Take 4 mg by mouth daily. am   Yes [provider]  fluticasone (FLONASE) 50 MCG/ACT nasal spray Place 2 sprays into both nostrils daily. 09/19/16   Lorin Picket, PA-C  nystatin (MYCOSTATIN) 100000 UNIT/ML suspension Swish and retain 5 mL in mouth before spitting out 4 times a day x7 to 14 days 03/21/20 04/04/20  Danton Clap, PA-C  Probiotic Product (PROBIOTIC-10 PO) Take by mouth.    [provider]    Family History Family History  Problem Relation Age of Onset  .  Heart failure Mother   . Hypertension Mother   . Asthma Mother   . Parkinson's disease Mother   . Diabetes Mother   . Heart failure Father   . Cirrhosis Sister     Social History Social History   Tobacco Use  . Smoking status: Former Research scientist (life sciences)  . Smokeless tobacco: Never Used  . Tobacco comment: smoked in college  Vaping Use  . Vaping Use: Never used  Substance Use Topics  . Alcohol use: Yes    Comment: may drink 1x/month  . Drug use: Never     Allergies   Patient has no known allergies.   Review of Systems Review of Systems    Constitutional: Negative for fatigue and fever.  HENT: Positive for mouth sores. Negative for sore throat and trouble swallowing.   Respiratory: Negative for cough and shortness of breath.   Gastrointestinal: Negative for nausea and vomiting.  Skin: Negative for rash.  Neurological: Negative for weakness.     Physical Exam Triage Vital Signs ED Triage Vitals  Enc Vitals Group     BP 03/21/20 1433 (!) 142/83     Pulse Rate 03/21/20 1433 70     Resp 03/21/20 1433 18     Temp 03/21/20 1433 98.7 F (37.1 C)     Temp Source 03/21/20 1433 Oral     SpO2 03/21/20 1433 100 %     Weight 03/21/20 1429 200 lb (90.7 kg)     Height 03/21/20 1429 5\' 5"  (1.651 m)     Head Circumference --      Peak Flow --      Pain Score 03/21/20 1429 0     Pain Loc --      Pain Edu? --      Excl. in [place]? --    No data found.  Updated Vital Signs BP (!) 142/83 (BP Location: Left Arm)   Pulse 70   Temp 98.7 F (37.1 C) (Oral)   Resp 18   Ht 5\' 5"  (1.651 m)   Wt 200 lb (90.7 kg)   SpO2 100%   BMI 33.28 kg/m       Physical Exam Vitals and nursing note reviewed.  Constitutional:      General: She is not in acute distress.    Appearance: Normal appearance. She is not ill-appearing or toxic-appearing.  HENT:     Head: Normocephalic and atraumatic.     Nose: Nose normal.     Mouth/Throat:     Mouth: Mucous membranes are moist.     Pharynx: Oropharyngeal exudate (whitish exudates bilateral buccal mucosa and posterior pharynx. Able to scrape exdates off with tongue depressor) and posterior oropharyngeal erythema present.  Eyes:     General: No scleral icterus.       Right eye: No discharge.        Left eye: No discharge.     Conjunctiva/sclera: Conjunctivae normal.  Cardiovascular:     Rate and Rhythm: Normal rate.  Pulmonary:     Effort: Pulmonary effort is normal. No respiratory distress.  Musculoskeletal:     Cervical back: Neck supple.  Skin:    General: Skin is dry.  Neurological:      General: No focal deficit present.     Mental Status: She is alert. Mental status is at baseline.     Motor: No weakness.     Gait: Gait normal.  Psychiatric:        Mood and Affect: Mood normal.  Behavior: Behavior normal.        Thought Content: Thought content normal.      UC Treatments / Results  Labs (all labs ordered are listed, but only abnormal results are displayed) Labs Reviewed - No data to display  EKG   Radiology No results found.  Procedures Procedures (including critical care time)  Medications Ordered in UC Medications - No data to display  Initial Impression / Assessment and Plan / UC Course  I have reviewed the triage vital signs and the nursing notes.  Pertinent labs & imaging results that were available during my care of the patient were reviewed by me and considered in my medical decision making (see chart for details).   Physical exam consistent with thrush/oral candidiasis.  Treating with nystatin suspension.  Advised to increase fluid intake.  Advised to follow-up with our dept as needed for any new or worsening symptoms or if she is not better in the next 2 weeks.   Final Clinical Impressions(s) / UC Diagnoses   Final diagnoses:  Thrush   Discharge Instructions   None    ED Prescriptions    Medication Sig Dispense Auth. Provider   nystatin (MYCOSTATIN) 100000 UNIT/ML suspension Swish and retain 5 mL in mouth before spitting out 4 times a day x7 to 14 days 280 mL Danton Clap, PA-C     PDMP not reviewed this encounter.   Danton Clap, PA-C 03/21/20 1531

## 2020-04-16 ENCOUNTER — Ambulatory Visit
Admission: EM | Admit: 2020-04-16 | Discharge: 2020-04-16 | Disposition: A | Discharge status: Home / Self Care | Payer: Medicare Other | Attending: Family Medicine | Admitting: Family Medicine

## 2020-04-16 ENCOUNTER — Emergency Department: Payer: Medicare Other

## 2020-04-16 ENCOUNTER — Other Ambulatory Visit: Payer: Self-pay

## 2020-04-16 ENCOUNTER — Ambulatory Visit (INDEPENDENT_AMBULATORY_CARE_PROVIDER_SITE_OTHER): Payer: Medicare Other

## 2020-04-16 ENCOUNTER — Encounter: Payer: Self-pay | Admitting: Emergency Medicine

## 2020-04-16 ENCOUNTER — Inpatient Hospital Stay
Admission: EM | Admit: 2020-04-16 | Discharge: 2020-04-21 | DRG: 482 | Disposition: A | Discharge status: Home Health | Payer: Medicare Other | Attending: Internal Medicine | Admitting: Internal Medicine

## 2020-04-16 DIAGNOSIS — S72002D Fracture of unspecified part of neck of left femur, subsequent encounter for closed fracture with routine healing: Secondary | ICD-10-CM | POA: Diagnosis not present

## 2020-04-16 DIAGNOSIS — Z87891 Personal history of nicotine dependence: Secondary | ICD-10-CM

## 2020-04-16 DIAGNOSIS — S72002A Fracture of unspecified part of neck of left femur, initial encounter for closed fracture: Secondary | ICD-10-CM

## 2020-04-16 DIAGNOSIS — R32 Unspecified urinary incontinence: Secondary | ICD-10-CM | POA: Diagnosis present

## 2020-04-16 DIAGNOSIS — Z833 Family history of diabetes mellitus: Secondary | ICD-10-CM

## 2020-04-16 DIAGNOSIS — W19XXXA Unspecified fall, initial encounter: Secondary | ICD-10-CM | POA: Diagnosis not present

## 2020-04-16 DIAGNOSIS — S72009A Fracture of unspecified part of neck of unspecified femur, initial encounter for closed fracture: Secondary | ICD-10-CM | POA: Diagnosis present

## 2020-04-16 DIAGNOSIS — Z20822 Contact with and (suspected) exposure to covid-19: Secondary | ICD-10-CM | POA: Diagnosis present

## 2020-04-16 DIAGNOSIS — Z825 Family history of asthma and other chronic lower respiratory diseases: Secondary | ICD-10-CM

## 2020-04-16 DIAGNOSIS — F32A Depression, unspecified: Secondary | ICD-10-CM | POA: Diagnosis present

## 2020-04-16 DIAGNOSIS — M170 Bilateral primary osteoarthritis of knee: Secondary | ICD-10-CM | POA: Diagnosis present

## 2020-04-16 DIAGNOSIS — Z8249 Family history of ischemic heart disease and other diseases of the circulatory system: Secondary | ICD-10-CM

## 2020-04-16 DIAGNOSIS — M25532 Pain in left wrist: Secondary | ICD-10-CM | POA: Diagnosis not present

## 2020-04-16 DIAGNOSIS — Z79899 Other long term (current) drug therapy: Secondary | ICD-10-CM | POA: Diagnosis not present

## 2020-04-16 DIAGNOSIS — Z820 Family history of epilepsy and other diseases of the nervous system: Secondary | ICD-10-CM

## 2020-04-16 DIAGNOSIS — S72012A Unspecified intracapsular fracture of left femur, initial encounter for closed fracture: Secondary | ICD-10-CM | POA: Diagnosis present

## 2020-04-16 DIAGNOSIS — W1830XA Fall on same level, unspecified, initial encounter: Secondary | ICD-10-CM | POA: Diagnosis present

## 2020-04-16 DIAGNOSIS — I10 Essential (primary) hypertension: Secondary | ICD-10-CM | POA: Diagnosis present

## 2020-04-16 DIAGNOSIS — M19072 Primary osteoarthritis, left ankle and foot: Secondary | ICD-10-CM | POA: Diagnosis present

## 2020-04-16 DIAGNOSIS — R1032 Left lower quadrant pain: Secondary | ICD-10-CM

## 2020-04-16 DIAGNOSIS — Z419 Encounter for procedure for purposes other than remedying health state, unspecified: Secondary | ICD-10-CM

## 2020-04-16 DIAGNOSIS — Z6834 Body mass index (BMI) 34.0-34.9, adult: Secondary | ICD-10-CM | POA: Diagnosis not present

## 2020-04-16 DIAGNOSIS — S72001A Fracture of unspecified part of neck of right femur, initial encounter for closed fracture: Secondary | ICD-10-CM | POA: Diagnosis not present

## 2020-04-16 HISTORY — DX: Fracture of unspecified part of neck of left femur, initial encounter for closed fracture: S72.002A

## 2020-04-16 LAB — CBC WITH DIFFERENTIAL/PLATELET
Abs Immature Granulocytes: 0.07 10*3/uL (ref 0.00–0.07)
Basophils Absolute: 0.1 10*3/uL (ref 0.0–0.1)
Basophils Relative: 0 %
Eosinophils Absolute: 0.1 10*3/uL (ref 0.0–0.5)
Eosinophils Relative: 1 %
HCT: 45.5 % (ref 36.0–46.0)
Hemoglobin: 14.8 g/dL (ref 12.0–15.0)
Immature Granulocytes: 1 %
Lymphocytes Relative: 9 %
Lymphs Abs: 1.1 10*3/uL (ref 0.7–4.0)
MCH: 30.6 pg (ref 26.0–34.0)
MCHC: 32.5 g/dL (ref 30.0–36.0)
MCV: 94.2 fL (ref 80.0–100.0)
Monocytes Absolute: 1.1 10*3/uL — ABNORMAL HIGH (ref 0.1–1.0)
Monocytes Relative: 9 %
Neutro Abs: 9.7 10*3/uL — ABNORMAL HIGH (ref 1.7–7.7)
Neutrophils Relative %: 80 %
Platelets: 216 10*3/uL (ref 150–400)
RBC: 4.83 MIL/uL (ref 3.87–5.11)
RDW: 14 % (ref 11.5–15.5)
WBC: 12.2 10*3/uL — ABNORMAL HIGH (ref 4.0–10.5)
nRBC: 0 % (ref 0.0–0.2)

## 2020-04-16 LAB — BASIC METABOLIC PANEL
Anion gap: 11 (ref 5–15)
BUN: 22 mg/dL (ref 8–23)
CO2: 22 mmol/L (ref 22–32)
Calcium: 9.1 mg/dL (ref 8.9–10.3)
Chloride: 104 mmol/L (ref 98–111)
Creatinine, Ser: 0.94 mg/dL (ref 0.44–1.00)
GFR, Estimated: 59 mL/min — ABNORMAL LOW (ref >60)
Glucose, Bld: 88 mg/dL (ref 70–99)
Potassium: 3.7 mmol/L (ref 3.5–5.1)
Sodium: 137 mmol/L (ref 135–145)

## 2020-04-16 LAB — RESP PANEL BY RT-PCR (FLU A&B, COVID) ARPGX2
Influenza A by PCR: NEGATIVE
Influenza B by PCR: NEGATIVE
SARS Coronavirus 2 by RT PCR: NEGATIVE

## 2020-04-16 LAB — TYPE AND SCREEN
ABO/RH(D): O POS
Antibody Screen: NEGATIVE

## 2020-04-16 LAB — PROTIME-INR
INR: 1 (ref 0.8–1.2)
Prothrombin Time: 12.9 seconds (ref 11.4–15.2)

## 2020-04-16 MED ORDER — SENNOSIDES-DOCUSATE SODIUM 8.6-50 MG PO TABS: 1.0000 | ORAL_TABLET | Freq: Every evening | ORAL | Status: DC | PRN

## 2020-04-16 MED ORDER — MIRABEGRON ER 25 MG PO TB24
25.0000 mg | ORAL_TABLET | Freq: Every day | ORAL | Status: DC
  Administered 2020-04-19 – 2020-04-21 (×3): 25 mg via ORAL
  Filled 2020-04-16 (×6): qty 1

## 2020-04-16 MED ORDER — LOSARTAN POTASSIUM 25 MG PO TABS
25.0000 mg | ORAL_TABLET | Freq: Every day | ORAL | Status: DC
  Administered 2020-04-20 – 2020-04-21 (×2): 25 mg via ORAL
  Filled 2020-04-16 (×2): qty 1

## 2020-04-16 MED ORDER — ONDANSETRON HCL 4 MG/2ML IJ SOLN
4.0000 mg | Freq: Once | INTRAMUSCULAR | Status: DC
  Filled 2020-04-16: qty 2

## 2020-04-16 MED ORDER — FLUOXETINE HCL 10 MG PO CAPS
30.0000 mg | ORAL_CAPSULE | Freq: Every day | ORAL | Status: DC
  Administered 2020-04-19 – 2020-04-21 (×3): 30 mg via ORAL
  Filled 2020-04-16 (×6): qty 3

## 2020-04-16 MED ORDER — FESOTERODINE FUMARATE ER 8 MG PO TB24
8.0000 mg | ORAL_TABLET | Freq: Every day | ORAL | Status: DC
  Administered 2020-04-19 – 2020-04-21 (×3): 8 mg via ORAL
  Filled 2020-04-16 (×6): qty 1

## 2020-04-16 MED ORDER — MORPHINE SULFATE (PF) 2 MG/ML IV SOLN
0.5000 mg | INTRAVENOUS | Status: DC | PRN
  Administered 2020-04-17: 0.5 mg via INTRAVENOUS
  Filled 2020-04-16: qty 1

## 2020-04-16 MED ORDER — MORPHINE SULFATE (PF) 4 MG/ML IV SOLN: 4.0000 mg | INTRAVENOUS | Status: DC | PRN

## 2020-04-16 MED ORDER — ENOXAPARIN SODIUM 60 MG/0.6ML ~~LOC~~ SOLN
0.5000 mg/kg | SUBCUTANEOUS | Status: DC
  Filled 2020-04-16: qty 0.6

## 2020-04-16 MED ORDER — CEFAZOLIN SODIUM-DEXTROSE 2-4 GM/100ML-% IV SOLN
2.0000 g | Freq: Once | INTRAVENOUS | Status: AC
  Administered 2020-04-18: 16:00:00 2 g via INTRAVENOUS
  Filled 2020-04-16: qty 100

## 2020-04-16 MED ORDER — HYDROCODONE-ACETAMINOPHEN 5-325 MG PO TABS
1.0000 | ORAL_TABLET | Freq: Four times a day (QID) | ORAL | Status: DC | PRN
  Administered 2020-04-16 – 2020-04-18 (×3): 2 via ORAL
  Filled 2020-04-16 (×3): qty 2

## 2020-04-16 MED ORDER — GABAPENTIN 100 MG PO CAPS
100.0000 mg | ORAL_CAPSULE | Freq: Two times a day (BID) | ORAL | Status: DC
  Administered 2020-04-16 – 2020-04-21 (×8): 100 mg via ORAL
  Filled 2020-04-16 (×8): qty 1

## 2020-04-16 MED ORDER — FLUTICASONE PROPIONATE 50 MCG/ACT NA SUSP
2.0000 | Freq: Every day | NASAL | Status: DC
  Administered 2020-04-17 – 2020-04-21 (×4): 2 via NASAL
  Filled 2020-04-16 (×2): qty 16

## 2020-04-16 MED ORDER — SORBITOL 70 % SOLN
30.0000 mL | Freq: Every day | Status: DC | PRN
  Filled 2020-04-16: qty 30

## 2020-04-16 MED ORDER — FLUTICASONE PROPIONATE 50 MCG/ACT NA SUSP: 2.0000 | Freq: Every day | NASAL | Status: DC

## 2020-04-16 NOTE — ED Triage Notes (Addendum)
Pt to ED via [facility]. Pt fell earlier today and went to [facility]. Pt found out that she had a left broken hip and was transferred to ED. Pt is a/o x 4 and has taken Tylenol.

## 2020-04-16 NOTE — ED Notes (Signed)
Patient is being discharged from the Urgent Care and sent to the Emergency Department via EMS . Per Dr. Lacinda Axon, patient is in need of higher level of care due to hip fracture. Patient is aware and verbalizes understanding of plan of care.  Vitals:   04/16/20 1443  BP: 132/72  Pulse: 71  Resp: 18  Temp: 98.5 F (36.9 C)  SpO2: 97%

## 2020-04-16 NOTE — H&P (Addendum)
Cynthia Ritter is an 86 y.o. female.   Chief Complaint: Pain in left hip following fall HPI: The patient went to [facility] when she had left hip pain following a mechanical fall earlier today. She was found to have a fracture of her left femoral neck.   The patient complains of mild to moderate pain in the left hip. She denies fevers, chills, cough, shortness of breath, chest pain, dizziness, weakness, nausea, vomiting, constipation, diarrhea, hematemesis, coffee ground emesis, hematochezia, melena, lateralizing neurological deficits, ambulator dysfunction, sores, wounds, or rashes.  Past Medical History:  Diagnosis Date  . Arthritis    left heel/ knees and ankle, wears a knee brace occasionally  . Depression   . Hot flashes   . Hypertension   . Incontinence    URINARY  . Motion sickness    planes, back seat-cars  . Seasonal allergies     Past Surgical History:  Procedure Laterality Date  . CATARACT EXTRACTION W/PHACO Right 09/26/2015   Procedure: CATARACT EXTRACTION PHACO AND INTRAOCULAR LENS PLACEMENT (IOC);  Surgeon: Leandrew Koyanagi, MD;  Location: [place];  Service: Ophthalmology;  Laterality: Right;  . CATARACT EXTRACTION W/PHACO Left 10/24/2015   Procedure: CATARACT EXTRACTION PHACO AND INTRAOCULAR LENS PLACEMENT ([place]) left eye;  Surgeon: Leandrew Koyanagi, MD;  Location: [place];  Service: Ophthalmology;  Laterality: Left;  . CHOLECYSTECTOMY    . COLONOSCOPY     X2    Family History  Problem Relation Age of Onset  . Heart failure Mother   . Hypertension Mother   . Asthma Mother   . Parkinson's disease Mother   . Diabetes Mother   . Heart failure Father   . Cirrhosis Sister    Social History:  reports that she has quit smoking. She has never used smokeless tobacco. She reports current alcohol use. She reports that she does not use drugs. (Not in a hospital admission)   Allergies: No Known Allergies  Pertinent items noted in HPI and  remainder of comprehensive ROS otherwise negative.   General appearance: alert Head: Normocephalic, without obvious abnormality, atraumatic Eyes: conjunctivae/corneas clear. PERRL, EOM's intact. Fundi benign. Throat: lips, mucosa, and tongue normal; teeth and gums normal Neck: no adenopathy, no carotid bruit, no JVD, supple, symmetrical, trachea midline and thyroid not enlarged, symmetric, no tenderness/mass/nodules Resp: No increased work of breathing. No wheezes, rales, or rhonchi. No tactile fremitus. Chest wall: no tenderness Cardio: regular rate and rhythm, S1, S2 normal, no murmur, click, rub or gallop GI: soft, non-tender; bowel sounds normal; no masses,  no organomegaly Extremities: extremities normal, atraumatic, no cyanosis or edema Pulses: 2+ and symmetric Skin: Skin color, texture, turgor normal. No rashes or lesions Lymph nodes: Cervical, supraclavicular, and axillary nodes normal. Neurologic: Alert and oriented X 3, normal strength and tone. Normal symmetric reflexes. Normal coordination and gait  Results for orders placed or performed during the hospital encounter of 04/16/20 (from the past 48 hour(s))  Sample to Blood Bank     Status: None (Preliminary result)   Collection Time: 04/16/20  4:37 PM  Result Value Ref Range   Blood Bank Specimen PENDING    Sample Expiration      04/19/2020,2359 Performed at [facility], [address] [place], [place] [address]   Resp Panel by RT-PCR (Flu A&B, Covid) Nasopharyngeal Swab     Status: None   Collection Time: 04/16/20  4:48 PM   Specimen: Nasopharyngeal Swab; Nasopharyngeal(NP) swabs in vial transport medium  Result Value Ref  Range   SARS Coronavirus 2 by RT PCR NEGATIVE NEGATIVE    Comment: (NOTE) SARS-CoV-2 target nucleic acids are NOT DETECTED.  The SARS-CoV-2 RNA is generally detectable in upper respiratory specimens during the acute phase of infection. The lowest concentration of SARS-CoV-2 viral copies  this assay can detect is 138 copies/mL. A negative result does not preclude SARS-Cov-2 infection and should not be used as the sole basis for treatment or other patient management decisions. A negative result may occur with  improper specimen collection/handling, submission of specimen other than nasopharyngeal swab, presence of viral mutation(s) within the areas targeted by this assay, and inadequate number of viral copies(<138 copies/mL). A negative result must be combined with clinical observations, patient history, and epidemiological information. The expected result is Negative.  Fact Sheet for Patients:  EntrepreneurPulse.com.au  Fact Sheet for Healthcare Providers:  IncredibleEmployment.be  This test is no t yet approved or cleared by the [place] FDA and  has been authorized for detection and/or diagnosis of SARS-CoV-2 by FDA under an Emergency Use Authorization (EUA). This EUA will remain  in effect (meaning this test can be used) for the duration of the COVID-19 declaration under Section 564(b)(1) of the Act, 21 U.S.C.section 360bbb-3(b)(1), unless the authorization is terminated  or revoked sooner.       Influenza A by PCR NEGATIVE NEGATIVE   Influenza B by PCR NEGATIVE NEGATIVE    Comment: (NOTE) The Xpert Xpress SARS-CoV-2/FLU/RSV plus assay is intended as an aid in the diagnosis of influenza from Nasopharyngeal swab specimens and should not be used as a sole basis for treatment. Nasal washings and aspirates are unacceptable for Xpert Xpress SARS-CoV-2/FLU/RSV testing.  Fact Sheet for Patients: EntrepreneurPulse.com.au  Fact Sheet for Healthcare Providers: IncredibleEmployment.be  This test is not yet approved or cleared by the [place] FDA and has been authorized for detection and/or diagnosis of SARS-CoV-2 by FDA under an Emergency Use Authorization (EUA). This EUA will  remain in effect (meaning this test can be used) for the duration of the COVID-19 declaration under Section 564(b)(1) of the Act, 21 U.S.C. section 360bbb-3(b)(1), unless the authorization is terminated or revoked.  Performed at [facility], [address] [place], [place] [address]   CBC with Differential     Status: Abnormal   Collection Time: 04/16/20  4:48 PM  Result Value Ref Range   WBC 12.2 (H) 4.0 - 10.5 K/uL   RBC 4.83 3.87 - 5.11 MIL/uL   Hemoglobin 14.8 12.0 - 15.0 g/dL   HCT 45.5 36.0 - 46.0 %   MCV 94.2 80.0 - 100.0 fL   MCH 30.6 26.0 - 34.0 pg   MCHC 32.5 30.0 - 36.0 g/dL   RDW 14.0 11.5 - 15.5 %   Platelets 216 150 - 400 K/uL   nRBC 0.0 0.0 - 0.2 %   Neutrophils Relative % 80 %   Neutro Abs 9.7 (H) 1.7 - 7.7 K/uL   Lymphocytes Relative 9 %   Lymphs Abs 1.1 0.7 - 4.0 K/uL   Monocytes Relative 9 %   Monocytes Absolute 1.1 (H) 0.1 - 1.0 K/uL   Eosinophils Relative 1 %   Eosinophils Absolute 0.1 0.0 - 0.5 K/uL   Basophils Relative 0 %   Basophils Absolute 0.1 0.0 - 0.1 K/uL   Immature Granulocytes 1 %   Abs Immature Granulocytes 0.07 0.00 - 0.07 K/uL    Comment: Performed at [facility], [address] [place], [place] [address]  Basic metabolic  panel     Status: Abnormal   Collection Time: 04/16/20  4:48 PM  Result Value Ref Range   Sodium 137 135 - 145 mmol/L   Potassium 3.7 3.5 - 5.1 mmol/L   Chloride 104 98 - 111 mmol/L   CO2 22 22 - 32 mmol/L   Glucose, Bld 88 70 - 99 mg/dL    Comment: Glucose reference range applies only to samples taken after fasting for at least 8 hours.   BUN 22 8 - 23 mg/dL   Creatinine, Ser 0.94 0.44 - 1.00 mg/dL   Calcium 9.1 8.9 - 10.3 mg/dL   GFR, Estimated 59 (L) >60 mL/min    Comment: (NOTE) Calculated using the CKD-EPI Creatinine Equation (2021)    Anion gap 11 5 - 15    Comment: Performed at [facility], [address] [place], [place] [address]  Protime-INR     Status: None    Collection Time: 04/16/20  4:57 PM  Result Value Ref Range   Prothrombin Time 12.9 11.4 - 15.2 seconds   INR 1.0 0.8 - 1.2    Comment: (NOTE) INR goal varies based on device and disease states. Performed at [facility], [address] [place], [place] [address]    @[id number] @  Blood pressure (!) 168/76, pulse 73, temperature 98.9 F (37.2 C), resp. rate 18, height 5\' 3"  (1.6 m), weight 88.9 kg, SpO2 99 %.   Principal Problem:   Femoral neck fracture (HCC) Active Problems:   Hypertension   Depression   Incontinence  Assessment/Plan Left femoral neck fracture: The patient sustained a fracture to her left femoral neck after a mechanical fall today. Dr. Posey Pronto of orthopedic surgery has evaluated the patient and will take her to the OR for ORIF tomorrow. EKG is non-ischemic. The patient has no history of CHF, chest pain, or CAD. She is at relatively low risk for perioperative morbidity and mortality except for her age. Even so, the risk of morbidity and mortality related to not repairing the fracture and returning the patient to mobility far outweighs that of the surgery itself. She will receive pain control as well as as needed stool softeners for possible constipation.  Hypertension: Chronic. Blood pressures are high in the ED. This is assumed to be due to the effect of pain. The patient's pain will be adequately controlled and she will be continued on her home dose of losartan with as needed hydralazine available. Monitor.  Depression: The patient will be continued on her home dose of prozac.   Incontinence: The patient takes Myrbetriq and Detrol at home. These will be continued. Will monitor for urinary retention which may occur in the setting of narcotic pain control.   I have seen and examined this patient myself. I have spent 68 minutes in her evaluation and admission.  DVT Prophylaxis: Lovenox - held as per Dr. Serita Grit instruction. CODE STATUS: Full Code Family  Communication: None available Disposition: Patient is from home. Anticipate a discharge to SNF for rehab.  Cynthia Ritter 04/16/2020, 5:41 PM

## 2020-04-16 NOTE — ED Provider Notes (Signed)
[facility] Emergency Department Provider Note    Event Date/Time   First MD Initiated Contact with Patient 04/16/20 1631     (approximate)  I have reviewed the triage vital signs and the nursing notes.   HISTORY  Chief Complaint Fall    HPI Cynthia Ritter is a 86 y.o. female with the below listed past medical history presents to the ER for evaluation of left hip pain that occurred after mechanical fall this morning.  Did not hit her head.  Went to urgent care due to hip pain and was found to have a left femoral neck fracture.  She was sent to the ER for further evaluation management.  States her pain is mild to moderate right now.  She is not on any anticoagulation.  No numbness or tingling.    Past Medical History:  Diagnosis Date  . Arthritis    left heel/ knees and ankle, wears a knee brace occasionally  . Depression   . Hot flashes   . Hypertension   . Incontinence    URINARY  . Motion sickness    planes, back seat-cars  . Seasonal allergies    Family History  Problem Relation Age of Onset  . Heart failure Mother   . Hypertension Mother   . Asthma Mother   . Parkinson's disease Mother   . Diabetes Mother   . Heart failure Father   . Cirrhosis Sister    Past Surgical History:  Procedure Laterality Date  . CATARACT EXTRACTION W/PHACO Right 09/26/2015   Procedure: CATARACT EXTRACTION PHACO AND INTRAOCULAR LENS PLACEMENT (IOC);  Surgeon: Leandrew Koyanagi, MD;  Location: [place];  Service: Ophthalmology;  Laterality: Right;  . CATARACT EXTRACTION W/PHACO Left 10/24/2015   Procedure: CATARACT EXTRACTION PHACO AND INTRAOCULAR LENS PLACEMENT ([place]) left eye;  Surgeon: Leandrew Koyanagi, MD;  Location: [place];  Service: Ophthalmology;  Laterality: Left;  . CHOLECYSTECTOMY    . COLONOSCOPY     X2   There are no problems to display for this patient.     Prior to Admission medications   Medication Sig Start Date  End Date Taking? Authorizing Provider  cyanocobalamin 1000 MCG tablet Take 1 tablet by mouth every other day.    [provider]  diclofenac Sodium (VOLTAREN) 1 % GEL Apply topically 2 (two) times daily. 05/11/18   [provider]  FLUoxetine (PROZAC) 20 MG tablet Take 30 mg by mouth daily. am    [provider]  fluticasone (FLONASE) 50 MCG/ACT nasal spray Place 2 sprays into both nostrils daily. 09/19/16   Lorin Picket, PA-C  gabapentin (NEURONTIN) 100 MG capsule SMARTSIG:0-2 By Mouth Twice Daily 03/17/20   [provider]  losartan (COZAAR) 25 MG tablet Take 25 mg by mouth daily. 01/14/20   [provider]  MYRBETRIQ 25 MG TB24 tablet Take 25 mg by mouth daily. 01/15/20   [provider]  Probiotic Product (PROBIOTIC-10 PO) Take by mouth.    [provider]  tolterodine (DETROL LA) 4 MG 24 hr capsule Take 4 mg by mouth daily. am    [provider]    Allergies Patient has no known allergies.    Social History Social History   Tobacco Use  . Smoking status: Former Research scientist (life sciences)  . Smokeless tobacco: Never Used  . Tobacco comment: smoked in college  Vaping Use  . Vaping Use: Never used  Substance Use Topics  . Alcohol use: Yes  Comment: may drink 1x/month  . Drug use: Never    Review of Systems Patient denies headaches, rhinorrhea, blurry vision, numbness, shortness of breath, chest pain, edema, cough, abdominal pain, nausea, vomiting, diarrhea, dysuria, fevers, rashes or hallucinations unless otherwise stated above in HPI. ____________________________________________   PHYSICAL EXAM:  VITAL SIGNS: Vitals:   04/16/20 1638 04/16/20 1704  BP:  (!) 168/76  Pulse:    Resp:    Temp: 98.9 F (37.2 C)   SpO2:      Constitutional: Alert and oriented.  Eyes: Conjunctivae are normal.  Head: Atraumatic. Nose: No congestion/rhinnorhea. Mouth/Throat: Mucous membranes are moist.   Neck: No stridor. Painless  ROM.  Cardiovascular: Normal rate, regular rhythm. Grossly normal heart sounds.  Good peripheral circulation. Respiratory: Normal respiratory effort.  No retractions. Lungs CTAB. Gastrointestinal: Soft and nontender. No distention. No abdominal bruits. No CVA tenderness. Genitourinary:  Musculoskeletal: Compartments soft, neurovascular intact distally.  Pain with logroll of the left hip.  No shortening or deformity.  No joint effusions. Neurologic:  Normal speech and language. No gross focal neurologic deficits are appreciated. No facial droop Skin:  Skin is warm, dry and intact. No rash noted. Psychiatric: Mood and affect are normal. Speech and behavior are normal.  ____________________________________________   LABS (all labs ordered are listed, but only abnormal results are displayed)  Results for orders placed or performed during the hospital encounter of 04/16/20 (from the past 24 hour(s))  CBC with Differential     Status: Abnormal   Collection Time: 04/16/20  4:48 PM  Result Value Ref Range   WBC 12.2 (H) 4.0 - 10.5 K/uL   RBC 4.83 3.87 - 5.11 MIL/uL   Hemoglobin 14.8 12.0 - 15.0 g/dL   HCT 45.5 36.0 - 46.0 %   MCV 94.2 80.0 - 100.0 fL   MCH 30.6 26.0 - 34.0 pg   MCHC 32.5 30.0 - 36.0 g/dL   RDW 14.0 11.5 - 15.5 %   Platelets 216 150 - 400 K/uL   nRBC 0.0 0.0 - 0.2 %   Neutrophils Relative % 80 %   Neutro Abs 9.7 (H) 1.7 - 7.7 K/uL   Lymphocytes Relative 9 %   Lymphs Abs 1.1 0.7 - 4.0 K/uL   Monocytes Relative 9 %   Monocytes Absolute 1.1 (H) 0.1 - 1.0 K/uL   Eosinophils Relative 1 %   Eosinophils Absolute 0.1 0.0 - 0.5 K/uL   Basophils Relative 0 %   Basophils Absolute 0.1 0.0 - 0.1 K/uL   Immature Granulocytes 1 %   Abs Immature Granulocytes 0.07 0.00 - 0.07 K/uL  Basic metabolic panel     Status: Abnormal   Collection Time: 04/16/20  4:48 PM  Result Value Ref Range   Sodium 137 135 - 145 mmol/L   Potassium 3.7 3.5 - 5.1 mmol/L   Chloride 104 98 - 111 mmol/L    CO2 22 22 - 32 mmol/L   Glucose, Bld 88 70 - 99 mg/dL   BUN 22 8 - 23 mg/dL   Creatinine, Ser 0.94 0.44 - 1.00 mg/dL   Calcium 9.1 8.9 - 10.3 mg/dL   GFR, Estimated 59 (L) >60 mL/min   Anion gap 11 5 - 15   ____________________________________________  EKG My review and personal interpretation at Time: 16:33   Indication: fall  Rate: 70  Rhythm: sinus Axis: normal Other: normal intervals, no stemi ____________________________________________  RADIOLOGY  I personally reviewed all radiographic images ordered to evaluate for the above acute complaints and  reviewed radiology reports and findings.  These findings were personally discussed with the patient.  Please see medical record for radiology report.  ____________________________________________   PROCEDURES  Procedure(s) performed:  Procedures    Critical Care performed: no ____________________________________________   INITIAL IMPRESSION / ASSESSMENT AND PLAN / ED COURSE  Pertinent labs & imaging results that were available during my care of the patient were reviewed by me and considered in my medical decision making (see chart for details).   DDX: Fracture, dislocation, contusion, musculoskeletal strain  SATRINA MAGALLANES is a 86 y.o. who presents to the ED with evidence of acute left hip fracture.  Remainder of exam is reassuring and benign no sign of compartment syndrome.  No head injury.  Blood work ordered for preop evaluation.  Have consulted orthopedics, Dr. Posey Pronto.  Will discuss with hospitalist for admission.  Have discussed with the patient and available family all diagnostics and treatments performed thus far and all questions were answered to the best of my ability. The patient demonstrates understanding and agreement with plan.      The patient was evaluated in Emergency Department today for the symptoms described in the history of present illness. He/she was evaluated in the context of the global COVID-19  pandemic, which necessitated consideration that the patient might be at risk for infection with the SARS-CoV-2 virus that causes COVID-19. Institutional protocols and algorithms that pertain to the evaluation of patients at risk for COVID-19 are in a state of rapid change based on information released by regulatory bodies including the CDC and federal and state organizations. These policies and algorithms were followed during the patient's care in the ED.  As part of my medical decision making, I reviewed the following data within the [place] notes reviewed and incorporated, Labs reviewed, notes from prior ED visits and [place] Controlled Substance Database   ____________________________________________   FINAL CLINICAL IMPRESSION(S) / ED DIAGNOSES  Final diagnoses:  Closed left hip fracture, initial encounter ([place])  Fall, initial encounter      NEW MEDICATIONS STARTED DURING THIS VISIT:  New Prescriptions   No medications on file     Note:  This document was prepared using Dragon voice recognition software and may include unintentional dictation errors.    Merlyn Lot, MD 04/16/20 1714

## 2020-04-16 NOTE — Progress Notes (Addendum)
Full consult note and discussion with patient to follow tomorrow.  Called by ED staff. Imaging reviewed.  - Plan for surgery tomorrow, likely afternoon.  - NPO after midnight - Hold anticoagulation - Admit to Hospitalist team.  

## 2020-04-16 NOTE — ED Notes (Signed)
Pt states that she doesn't want pain meds or Zofran

## 2020-04-16 NOTE — ED Provider Notes (Signed)
[facility]    CSN: [id number] Arrival date & time: 04/16/20  1404      History   Chief Complaint Chief Complaint  Patient presents with   Fall   Hip Pain    left   HPI  86 year old female presents with the above complaint.  Patient suffered a fall earlier this morning around 11 AM.  She went to sit down in the chair and it was not in the position she thought it was then and she fell directly to the floor injuring her hip and left wrist.  She has been bearing weight but has difficulty doing so.  She reports pain on the lateral aspect of the hip as well as the groin/inguinal region.  Also reports some mild pain of the left wrist.  Past Medical History:  Diagnosis Date   Arthritis    left heel/ knees and ankle, wears a knee brace occasionally   Depression    Hot flashes    Hypertension    Incontinence    URINARY   Motion sickness    planes, back seat-cars   Seasonal allergies    Past Surgical History:  Procedure Laterality Date   CATARACT EXTRACTION W/PHACO Right 09/26/2015   Procedure: CATARACT EXTRACTION PHACO AND INTRAOCULAR LENS PLACEMENT ([place]);  Surgeon: Leandrew Koyanagi, MD;  Location: [place];  Service: Ophthalmology;  Laterality: Right;   CATARACT EXTRACTION W/PHACO Left 10/24/2015   Procedure: CATARACT EXTRACTION PHACO AND INTRAOCULAR LENS PLACEMENT ([place]) left eye;  Surgeon: Leandrew Koyanagi, MD;  Location: [place];  Service: Ophthalmology;  Laterality: Left;   CHOLECYSTECTOMY     COLONOSCOPY     X2   OB History   No obstetric history on file.    Home Medications    Prior to Admission medications   Medication Sig Start Date End Date Taking? Authorizing Provider  cyanocobalamin 1000 MCG tablet Take 1 tablet by mouth every other day.   Yes [provider]  diclofenac Sodium (VOLTAREN) 1 % GEL Apply topically 2 (two) times daily. 05/11/18  Yes [provider]  FLUoxetine (PROZAC) 20 MG  tablet Take 30 mg by mouth daily. am   Yes [provider]  fluticasone (FLONASE) 50 MCG/ACT nasal spray Place 2 sprays into both nostrils daily. 09/19/16  Yes Lorin Picket, PA-C  gabapentin (NEURONTIN) 100 MG capsule SMARTSIG:0-2 By Mouth Twice Daily 03/17/20  Yes [provider]  losartan (COZAAR) 25 MG tablet Take 25 mg by mouth daily. 01/14/20  Yes [provider]  MYRBETRIQ 25 MG TB24 tablet Take 25 mg by mouth daily. 01/15/20  Yes [provider]  Probiotic Product (PROBIOTIC-10 PO) Take by mouth.   Yes [provider]  tolterodine (DETROL LA) 4 MG 24 hr capsule Take 4 mg by mouth daily. am   Yes [provider]   Family History Family History  Problem Relation Age of Onset   Heart failure Mother    Hypertension Mother    Asthma Mother    Parkinson's disease Mother    Diabetes Mother    Heart failure Father    Cirrhosis Sister    Social History Social History   Tobacco Use   Smoking status: Former Smoker   Smokeless tobacco: Never Used   Tobacco comment: smoked in [employer] Use: Never used  Substance Use Topics   Alcohol use: Yes    Comment: may drink 1x/month   Drug use: Never   Allergies  Patient has no known allergies.   Review of Systems Review of Systems  Constitutional: Negative.   Musculoskeletal:       Fall, left hip pain and left wrist.    Physical Exam Triage Vital Signs ED Triage Vitals  Enc Vitals Group     BP 04/16/20 1443 132/72     Pulse Rate 04/16/20 1443 71     Resp 04/16/20 1443 18     Temp 04/16/20 1443 98.5 F (36.9 C)     Temp Source 04/16/20 1443 Oral     SpO2 04/16/20 1443 97 %     Weight 04/16/20 1441 196 lb (88.9 kg)     Height 04/16/20 1441 5' 3.8" (1.621 m)     Head Circumference --      Peak Flow --      Pain Score 04/16/20 1441 9     Pain Loc --      Pain Edu? --      Excl. in [place]? --    Updated Vital Signs BP 132/72 (BP Location:  Left Arm)    Pulse 71    Temp 98.5 F (36.9 C) (Oral)    Resp 18    Ht 5' 3.8" (1.621 m)    Wt 88.9 kg    SpO2 97%    BMI 33.85 kg/m   Visual Acuity Right Eye Distance:   Left Eye Distance:   Bilateral Distance:    Right Eye Near:   Left Eye Near:    Bilateral Near:     Physical Exam Vitals and nursing note reviewed.  Constitutional:      General: She is not in acute distress.    Appearance: Normal appearance. She is not ill-appearing.  HENT:     Head: Normocephalic and atraumatic.  Eyes:     General:        Right eye: No discharge.        Left eye: No discharge.     Conjunctiva/sclera: Conjunctivae normal.  Pulmonary:     Effort: Pulmonary effort is normal. No respiratory distress.  Musculoskeletal:     Comments: Left wrist -no discrete days of tenderness.  Normal range of motion.  Left hip -tenderness laterally.  Exquisite pain with internal rotation of the hip.  Neurological:     Mental Status: She is alert.  Psychiatric:        Mood and Affect: Mood normal.        Behavior: Behavior normal.     UC Treatments / Results  Labs (all labs ordered are listed, but only abnormal results are displayed) Labs Reviewed - No data to display  EKG   Radiology DG Wrist Complete Left  Result Date: 04/16/2020 CLINICAL DATA:  Status post fall EXAM: LEFT WRIST - COMPLETE 3+ VIEW COMPARISON:  None. FINDINGS: No fracture or malalignment. Mild degenerative change at the STT interval and first [facility] joint. IMPRESSION: No acute osseous abnormality. Electronically Signed   By: Donavan Foil M.D.   On: 04/16/2020 15:52   DG Hip Unilat With Pelvis 2-3 Views Left  Result Date: 04/16/2020 CLINICAL DATA:  Fall EXAM: DG HIP (WITH OR WITHOUT PELVIS) 2-3V LEFT COMPARISON:  None. FINDINGS: Osteopenia. There is an angulated and impacted subcapital fracture of the left femoral neck. No other evidence of pelvic fracture or fracture of the proximal right femur seen in frontal view only.  IMPRESSION: There is an angulated and impacted subcapital fracture of the left femoral neck. Electronically Signed   By: Cristie Hem  Laqueta Carina M.D.   On: 04/16/2020 15:55    Procedures Procedures (including critical care time)  Medications Ordered in UC Medications - No data to display  Initial Impression / Assessment and Plan / UC Course  I have reviewed the triage vital signs and the nursing notes.  Pertinent labs & imaging results that were available during my care of the patient were reviewed by me and considered in my medical decision making (see chart for details).    86 year old female presents for evaluation after suffering a fall.  Patient suffered a hip fracture.  X-rays obtained and independently reviewed by me.  Interpretation: Femoral neck fracture with impaction and angulation.  X-rays of the wrist were negative.  Patient is being transported to the hospital via EMS.  Final Clinical Impressions(s) / UC Diagnoses   Final diagnoses:  Fall  Closed fracture of neck of left femur, initial encounter [facility])   Discharge Instructions   None    ED Prescriptions    None     PDMP not reviewed this encounter.   Coral Spikes, DO 04/16/20 1600

## 2020-04-16 NOTE — ED Triage Notes (Signed)
Patient states that around 11am this morning. Patient states that she went to sit in a chair this morning and there was no chair there. States that she ended up falling on her left hip. Patient states that pain is worse when she tries to bear weight. Patient states that pain hurts on outside of hip and inside of hip and radiating up to groin.

## 2020-04-17 LAB — SURGICAL PCR SCREEN
MRSA, PCR: NEGATIVE
Staphylococcus aureus: NEGATIVE

## 2020-04-17 MED ORDER — MUPIROCIN 2 % EX OINT
1.0000 "application " | TOPICAL_OINTMENT | Freq: Two times a day (BID) | CUTANEOUS | Status: DC
  Administered 2020-04-17 – 2020-04-21 (×9): 1 via NASAL
  Filled 2020-04-17: qty 22

## 2020-04-17 NOTE — Progress Notes (Signed)
PROGRESS NOTE  Cynthia Ritter QMV:[id number] DOB: December 03, 1932 DOA: 04/16/2020 PCP: Myrene Buddy, NP  Brief History   The patient went to [facility] when she had left hip pain following a mechanical fall earlier today. She was found to have a fracture of her left femoral neck.   The patient complains of mild to moderate pain in the left hip. She denies fevers, chills, cough, shortness of breath, chest pain, dizziness, weakness, nausea, vomiting, constipation, diarrhea, hematemesis, coffee ground emesis, hematochezia, melena, lateralizing neurological deficits, ambulator dysfunction, sores, wounds, or rashes.  [facility] were consulted to admit the patient for further evaluation and treatment. Dr. Allena Katz from orthopedic surgery has evaluated the patient. They will be taking her to surgery to repair her him later today.  Consultants  . Orthopedic surgery  Procedures  . None  Antibiotics   Anti-infectives (From admission, onward)   Start     Dose/Rate Route Frequency Ordered Stop   04/17/20 1500  ceFAZolin (ANCEF) IVPB 2g/100 mL premix        2 g 200 mL/hr over 30 Minutes Intravenous Once 04/16/20 2144      .   Subjective  The patient is resting comfortably. No new complaints.  Objective   Vitals:  Vitals:   04/17/20 1116 04/17/20 1538  BP: 125/71 135/77  Pulse: 65 69  Resp: 14 18  Temp: 98.2 F (36.8 C) 98.9 F (37.2 C)  SpO2: 98% 96%   Exam:  Constitutional:  . The patient is awake, alert, and oriented x 3. No acute distress. Respiratory:  . No increased work of breathing. . No wheezes, rales, or rhonchi . No tactile fremitus Cardiovascular:  . Regular rate and rhythm . No murmurs, ectopy, or gallups. . No lateral PMI. No thrills. Abdomen:  . Abdomen is soft, non-tender, non-distended . No hernias, masses, or organomegaly . Normoactive bowel sounds.  Musculoskeletal:  . No cyanosis, clubbing, or edema Skin:  . No rashes, lesions,  ulcers . palpation of skin: no induration or nodules Neurologic:  . CN 2-12 intact . Sensation all 4 extremities intact Psychiatric:  . Mental status o Mood, affect appropriate o Orientation to person, place, time  . judgment and insight appear intact   I have personally reviewed the following:   Today's Data  . Vitals, BMP, CBC  Imaging  . CXR . Left hip and pelvis . Left wrist  Cardiology Data  . EKG  Scheduled Meds: . fesoterodine  8 mg Oral Daily  . FLUoxetine  30 mg Oral Daily  . fluticasone  2 spray Each Nare Daily  . gabapentin  100 mg Oral BID  . losartan  25 mg Oral Daily  . mirabegron ER  25 mg Oral Daily  . mupirocin ointment  1 application Nasal BID  . ondansetron (ZOFRAN) IV  4 mg Intravenous Once   Continuous Infusions: .  ceFAZolin (ANCEF) IV      Principal Problem:   Femoral neck fracture (HCC) Active Problems:   Hypertension   Depression   Incontinence   LOS: 1 day   A & P  Left femoral neck fracture: The patient sustained a fracture to her left femoral neck after a mechanical fall today. Dr. Allena Katz of orthopedic surgery has evaluated the patient and will take her to the OR for ORIF tomorrow. EKG is non-ischemic. The patient has no history of CHF, chest pain, or CAD. She is at relatively low risk for perioperative morbidity and mortality except for her age.  Even so, the risk of morbidity and mortality related to not repairing the fracture and returning the patient to mobility far outweighs that of the surgery itself. She will receive pain control as well as as needed stool softeners for possible constipation.  Hypertension: Chronic. Blood pressures are high in the ED. This is assumed to be due to the effect of pain. The patient's pain will be adequately controlled and she will be continued on her home dose of losartan with as needed hydralazine available. Monitor.  Depression: The patient will be continued on her home dose of prozac.    Incontinence: The patient takes Myrbetriq and Detrol at home. These will be continued. Will monitor for urinary retention which may occur in the setting of narcotic pain control.   I have seen and examined this patient myself. I have spent 68 minutes in her evaluation and admission.  DVT Prophylaxis: Lovenox - held as per Dr. Eliane Decree instruction. CODE STATUS: Full Code Family Communication: None available Disposition:  Status is: Inpatient  Remains inpatient appropriate because:Inpatient level of care appropriate due to severity of illness   Dispo: The patient is from: Home              Anticipated d/c is to: SNF              Anticipated d/c date is: 2 days              Patient currently is not medically stable to d/c.  Lavren Lewan, DO [facility] Direct contact: see [email or web address]  7PM-7AM contact night coverage as above 04/17/2020, 3:46 PM  LOS: 1 day

## 2020-04-17 NOTE — Consult Note (Signed)
ORTHOPAEDIC CONSULTATION  REQUESTING PHYSICIAN: Swayze, Ava, DO  Chief Complaint:   L hip pain  History of Present Illness: Cynthia Ritter is a 86 y.o. female who had a fall yesterday after she missed a chair.  The patient noted immediate hip pain and difficulty with weight-bearing.  The patient ambulates unassisted at baseline.  The patient lives independently with her husband and son. She states she was able to ambulate with a walker while dragging her leg and was able to be transported in a car. Pain is described as sharp at its worst and a dull ache at its best. Pain is improved with rest and immobilization.  Pain is worse with weight-bearing.  X-rays in the emergency department show a left valgus impacted femoral neck fracture.  Past Medical History:  Diagnosis Date  . Arthritis    left heel/ knees and ankle, wears a knee brace occasionally  . Depression   . Hot flashes   . Hypertension   . Incontinence    URINARY  . Motion sickness    planes, back seat-cars  . Seasonal allergies    Past Surgical History:  Procedure Laterality Date  . CATARACT EXTRACTION W/PHACO Right 09/26/2015   Procedure: CATARACT EXTRACTION PHACO AND INTRAOCULAR LENS PLACEMENT ([facility]);  Surgeon: Leandrew Koyanagi, MD;  Location: [facility];  Service: Ophthalmology;  Laterality: Right;  . CATARACT EXTRACTION W/PHACO Left 10/24/2015   Procedure: CATARACT EXTRACTION PHACO AND INTRAOCULAR LENS PLACEMENT ([place]) left eye;  Surgeon: Leandrew Koyanagi, MD;  Location: [facility];  Service: Ophthalmology;  Laterality: Left;  . CHOLECYSTECTOMY    . COLONOSCOPY     X2   Social History   Socioeconomic History  . Marital status: Married    Spouse name: Not on file  . Number of children: Not on file  . Years of education: Not on file  . Highest education level: Not on file  Occupational History  . Not on file  Tobacco Use  . Smoking  status: Former Research scientist (life sciences)  . Smokeless tobacco: Never Used  . Tobacco comment: smoked in college  Vaping Use  . Vaping Use: Never used  Substance and Sexual Activity  . Alcohol use: Yes    Comment: may drink 1x/month  . Drug use: Never  . Sexual activity: Not on file  Other Topics Concern  . Not on file  Social History Narrative  . Not on file   Social Determinants of Health   Financial Resource Strain: Not on file  Food Insecurity: Not on file  Transportation Needs: Not on file  Physical Activity: Not on file  Stress: Not on file  Social Connections: Not on file   Family History  Problem Relation Age of Onset  . Heart failure Mother   . Hypertension Mother   . Asthma Mother   . Parkinson's disease Mother   . Diabetes Mother   . Heart failure Father   . Cirrhosis Sister    No Known Allergies Prior to Admission medications   Medication Sig Start Date End Date Taking? Authorizing Provider  cyanocobalamin 1000 MCG tablet Take 1,000 mcg by mouth every other day.   Yes [provider]  FLUoxetine (PROZAC) 10 MG capsule Take 10 mg by mouth daily. Take with 20mg  capsule to equal 30mg  total   Yes [provider]  FLUoxetine (PROZAC) 20 MG capsule Take 20 mg by mouth daily. Take with 10mg  capsule to equal 30mg  total   Yes [provider]  losartan (COZAAR) 25  MG tablet Take 25 mg by mouth daily. 01/14/20  Yes [provider]  MYRBETRIQ 25 MG TB24 tablet Take 25 mg by mouth daily. 01/15/20  Yes [provider]  naproxen sodium (ALEVE) 220 MG tablet Take 220-440 mg by mouth 2 (two) times daily as needed (pain).   Yes [provider]  tolterodine (DETROL LA) 4 MG 24 hr capsule Take 4 mg by mouth daily.   Yes [provider]   Recent Labs    04/16/20 1648 04/16/20 1657  WBC 12.2*  --   HGB 14.8  --   HCT 45.5  --   PLT 216  --   K 3.7  --   CL 104  --   CO2 22  --   BUN 22  --   CREATININE 0.94  --   GLUCOSE 88  --    CALCIUM 9.1  --   INR  --  1.0   DG Wrist Complete Left  Result Date: 04/16/2020 CLINICAL DATA:  Status post fall EXAM: LEFT WRIST - COMPLETE 3+ VIEW COMPARISON:  None. FINDINGS: No fracture or malalignment. Mild degenerative change at the STT interval and first [facility] joint. IMPRESSION: No acute osseous abnormality. Electronically Signed   By: Donavan Foil M.D.   On: 04/16/2020 15:52   DG Chest Portable 1 View  Result Date: 04/16/2020 CLINICAL DATA:  Preoperative EXAM: PORTABLE CHEST 1 VIEW COMPARISON:  05/28/2013 FINDINGS: The heart size and mediastinal contours are within normal limits. Minimal diffuse interstitial pulmonary opacity. The visualized skeletal structures are unremarkable. IMPRESSION: Minimal diffuse interstitial pulmonary opacity, most likely minimal edema. No focal airspace opacity. Electronically Signed   By: Eddie Candle M.D.   On: 04/16/2020 17:46   DG Hip Unilat With Pelvis 2-3 Views Left  Result Date: 04/16/2020 CLINICAL DATA:  Fall EXAM: DG HIP (WITH OR WITHOUT PELVIS) 2-3V LEFT COMPARISON:  None. FINDINGS: Osteopenia. There is an angulated and impacted subcapital fracture of the left femoral neck. No other evidence of pelvic fracture or fracture of the proximal right femur seen in frontal view only. IMPRESSION: There is an angulated and impacted subcapital fracture of the left femoral neck. Electronically Signed   By: Eddie Candle M.D.   On: 04/16/2020 15:55     Positive ROS: All other systems have been reviewed and were otherwise negative with the exception of those mentioned in the HPI and as above.  Physical Exam: BP 125/71 (BP Location: Left Arm)   Pulse 65   Temp 98.2 F (36.8 C) (Oral)   Resp 14   Ht 5\' 3"  (1.6 m)   Wt 88.9 kg   SpO2 98%   BMI 34.72 kg/m  General:  Alert, no acute distress Psychiatric:  Patient is competent for consent with normal mood and affect   Cardiovascular:  No pedal edema, regular rate and rhythm Respiratory:  No wheezing,  non-labored breathing GI:  Abdomen is soft and non-tender Skin:  No lesions in the area of chief complaint, no erythema Neurologic:  Sensation intact distally, CN grossly intact Lymphatic:  No axillary or cervical lymphadenopathy  Orthopedic Exam:  LLE: + DF/PF/EHL SILT grossly over foot Foot wwp +axial load   X-rays:  As above: L valgus impacted femoral neck fracture  Assessment/Plan: MELIA HOPES is a 86 y.o. female with a L valgus impacted femoral neck fracture   1. I discussed the various treatment options including both surgical and non-surgical management of the fracture with the patient and/or family (  medical PoA). We discussed the high risk of perioperative complications due to patient's age and other co-morbidities. After discussion of risks, benefits, and alternatives to surgery, the family and/or patient were in agreement to proceed with surgery. The goals of surgery would be to provide adequate pain relief and allow for mobilization. Plan for surgery is L hip percutaneous pinning today, pending OR availability. Otherwise, surgery may have to be postponed until tomorrow. 2. NPO until OR 3. Hold anticoagulation in advance of OR   Leim Fabry   04/17/2020 3:29 PM

## 2020-04-17 NOTE — Progress Notes (Signed)
Initial plan was for patient to undergo surgical repair of hip fracture today.  However given lack of OR availability, plan will be for surgery tomorrow.  N.p.o. after midnight tonight. 

## 2020-04-18 ENCOUNTER — Inpatient Hospital Stay: Payer: Medicare Other | Admitting: Anesthesiology

## 2020-04-18 ENCOUNTER — Inpatient Hospital Stay: Payer: Medicare Other

## 2020-04-18 ENCOUNTER — Encounter
Admission: EM | Disposition: A | Discharge status: Home Health | Payer: Self-pay | Source: Home / Self Care | Attending: Internal Medicine

## 2020-04-18 ENCOUNTER — Encounter: Payer: Self-pay | Admitting: Internal Medicine

## 2020-04-18 DIAGNOSIS — I10 Essential (primary) hypertension: Secondary | ICD-10-CM

## 2020-04-18 DIAGNOSIS — F32A Depression, unspecified: Secondary | ICD-10-CM

## 2020-04-18 HISTORY — PX: HIP PINNING,CANNULATED: SHX1758

## 2020-04-18 LAB — BASIC METABOLIC PANEL
Anion gap: 9 (ref 5–15)
BUN: 22 mg/dL (ref 8–23)
CO2: 26 mmol/L (ref 22–32)
Calcium: 8.6 mg/dL — ABNORMAL LOW (ref 8.9–10.3)
Chloride: 106 mmol/L (ref 98–111)
Creatinine, Ser: 0.99 mg/dL (ref 0.44–1.00)
GFR, Estimated: 56 mL/min — ABNORMAL LOW (ref >60)
Glucose, Bld: 110 mg/dL — ABNORMAL HIGH (ref 70–99)
Potassium: 4.3 mmol/L (ref 3.5–5.1)
Sodium: 141 mmol/L (ref 135–145)

## 2020-04-18 LAB — CBC WITH DIFFERENTIAL/PLATELET
Abs Immature Granulocytes: 0.03 10*3/uL (ref 0.00–0.07)
Basophils Absolute: 0.1 10*3/uL (ref 0.0–0.1)
Basophils Relative: 1 %
Eosinophils Absolute: 0.3 10*3/uL (ref 0.0–0.5)
Eosinophils Relative: 4 %
HCT: 41.2 % (ref 36.0–46.0)
Hemoglobin: 13.1 g/dL (ref 12.0–15.0)
Immature Granulocytes: 0 %
Lymphocytes Relative: 17 %
Lymphs Abs: 1.3 10*3/uL (ref 0.7–4.0)
MCH: 30.5 pg (ref 26.0–34.0)
MCHC: 31.8 g/dL (ref 30.0–36.0)
MCV: 95.8 fL (ref 80.0–100.0)
Monocytes Absolute: 1 10*3/uL (ref 0.1–1.0)
Monocytes Relative: 13 %
Neutro Abs: 4.8 10*3/uL (ref 1.7–7.7)
Neutrophils Relative %: 65 %
Platelets: 213 10*3/uL (ref 150–400)
RBC: 4.3 MIL/uL (ref 3.87–5.11)
RDW: 14.4 % (ref 11.5–15.5)
WBC: 7.5 10*3/uL (ref 4.0–10.5)
nRBC: 0 % (ref 0.0–0.2)

## 2020-04-18 SURGERY — FIXATION, FEMUR, NECK, PERCUTANEOUS, USING SCREW
Anesthesia: Spinal | Site: Hip | Laterality: Left

## 2020-04-18 MED ORDER — BUPIVACAINE LIPOSOME 1.3 % IJ SUSP
INTRAMUSCULAR | Status: DC | PRN
  Administered 2020-04-18: 17:00:00 30 mL

## 2020-04-18 MED ORDER — ADULT MULTIVITAMIN W/MINERALS CH
1.0000 | ORAL_TABLET | Freq: Every day | ORAL | Status: DC
  Administered 2020-04-19 – 2020-04-21 (×3): 1 via ORAL
  Filled 2020-04-18 (×3): qty 1

## 2020-04-18 MED ORDER — CHLORHEXIDINE GLUCONATE CLOTH 2 % EX PADS
6.0000 | MEDICATED_PAD | Freq: Every day | CUTANEOUS | Status: DC
  Administered 2020-04-18: 23:00:00 6 via TOPICAL

## 2020-04-18 MED ORDER — ONDANSETRON HCL 4 MG/2ML IJ SOLN: 4.0000 mg | Freq: Once | INTRAMUSCULAR | Status: AC | PRN

## 2020-04-18 MED ORDER — BUPIVACAINE LIPOSOME 1.3 % IJ SUSP
INTRAMUSCULAR | Status: AC
  Filled 2020-04-18: qty 20

## 2020-04-18 MED ORDER — PROPOFOL 10 MG/ML IV BOLUS
INTRAVENOUS | Status: DC | PRN
  Administered 2020-04-18: 20 mg via INTRAVENOUS
  Administered 2020-04-18: 30 mg via INTRAVENOUS
  Administered 2020-04-18: 50 mg via INTRAVENOUS

## 2020-04-18 MED ORDER — SODIUM CHLORIDE 0.9 % IR SOLN
Status: DC | PRN
  Administered 2020-04-18: 17:00:00 500 mL

## 2020-04-18 MED ORDER — ENSURE ENLIVE PO LIQD
237.0000 mL | Freq: Two times a day (BID) | ORAL | Status: DC
  Administered 2020-04-19 – 2020-04-20 (×2): 237 mL via ORAL

## 2020-04-18 MED ORDER — ONDANSETRON HCL 4 MG/2ML IJ SOLN
INTRAMUSCULAR | Status: AC
  Administered 2020-04-18: 18:00:00 4 mg via INTRAVENOUS
  Filled 2020-04-18: qty 2

## 2020-04-18 MED ORDER — BUPIVACAINE HCL (PF) 0.5 % IJ SOLN
INTRAMUSCULAR | Status: DC | PRN
  Administered 2020-04-18: 3 mL via INTRATHECAL

## 2020-04-18 MED ORDER — PHENYLEPHRINE HCL (PRESSORS) 10 MG/ML IV SOLN
INTRAVENOUS | Status: DC | PRN
  Administered 2020-04-18 (×3): 100 ug via INTRAVENOUS

## 2020-04-18 MED ORDER — PROPOFOL 500 MG/50ML IV EMUL
INTRAVENOUS | Status: DC | PRN
  Administered 2020-04-18: 50 ug/kg/min via INTRAVENOUS

## 2020-04-18 MED ORDER — BUPIVACAINE HCL (PF) 0.5 % IJ SOLN
INTRAMUSCULAR | Status: AC
  Filled 2020-04-18: qty 30

## 2020-04-18 MED ORDER — NEOMYCIN-POLYMYXIN B GU 40-200000 IR SOLN
Status: AC
  Filled 2020-04-18: qty 1

## 2020-04-18 MED ORDER — LACTATED RINGERS IV SOLN: INTRAVENOUS | Status: DC | PRN

## 2020-04-18 MED ORDER — FENTANYL CITRATE (PF) 100 MCG/2ML IJ SOLN: 25.0000 ug | INTRAMUSCULAR | Status: DC | PRN

## 2020-04-18 SURGICAL SUPPLY — 44 items
BIT DRILL 4.9 CANNULATED (BIT) ×1
BIT DRILL CANN QC 4.9 LRG (BIT) ×1 IMPLANT
BLADE SURG 15 STRL LF DISP TIS (BLADE) ×1 IMPLANT
BLADE SURG 15 STRL SS (BLADE) ×1
CANISTER SUCT 1200ML W/VALVE (MISCELLANEOUS) ×2 IMPLANT
CHLORAPREP W/TINT 26 (MISCELLANEOUS) ×2 IMPLANT
COVER WAND RF STERILE (DRAPES) ×2 IMPLANT
DRAPE 3/4 80X56 (DRAPES) ×2 IMPLANT
DRAPE SURG 17X11 SM STRL (DRAPES) ×4 IMPLANT
DRAPE U-SHAPE 47X51 STRL (DRAPES) ×4 IMPLANT
DRILL BIT CANNULATED 4.9 (BIT) ×1
DRSG OPSITE POSTOP 3X4 (GAUZE/BANDAGES/DRESSINGS) ×2 IMPLANT
ELECT REM PT RETURN 9FT ADLT (ELECTROSURGICAL) ×2
ELECTRODE REM PT RTRN 9FT ADLT (ELECTROSURGICAL) ×1 IMPLANT
GAUZE XEROFORM 1X8 LF (GAUZE/BANDAGES/DRESSINGS) ×2 IMPLANT
GLOVE BIOGEL PI IND STRL 8 (GLOVE) ×1 IMPLANT
GLOVE BIOGEL PI INDICATOR 8 (GLOVE) ×1
GLOVE SURG SYN 7.5  E (GLOVE) ×2
GLOVE SURG SYN 7.5 E (GLOVE) ×2 IMPLANT
GOWN STRL REUS W/ TWL LRG LVL3 (GOWN DISPOSABLE) ×1 IMPLANT
GOWN STRL REUS W/ TWL XL LVL3 (GOWN DISPOSABLE) ×1 IMPLANT
GOWN STRL REUS W/TWL LRG LVL3 (GOWN DISPOSABLE) ×1
GOWN STRL REUS W/TWL XL LVL3 (GOWN DISPOSABLE) ×1
GUIDEWIRE ASNIS 3.2 NONCAL (WIRE) ×6 IMPLANT
KIT TURNOVER CYSTO (KITS) ×2 IMPLANT
MANIFOLD NEPTUNE II (INSTRUMENTS) ×2 IMPLANT
MAT ABSORB  FLUID 56X50 GRAY (MISCELLANEOUS) ×2
MAT ABSORB FLUID 56X50 GRAY (MISCELLANEOUS) ×2 IMPLANT
NEEDLE FILTER BLUNT 18X 1/2SAF (NEEDLE) ×1
NEEDLE FILTER BLUNT 18X1 1/2 (NEEDLE) ×1 IMPLANT
NEEDLE HYPO 22GX1.5 SAFETY (NEEDLE) ×2 IMPLANT
NS IRRIG 500ML POUR BTL (IV SOLUTION) ×2 IMPLANT
PACK HIP COMPR (MISCELLANEOUS) ×2 IMPLANT
PENCIL ELECTRO HAND CTR (MISCELLANEOUS) ×2 IMPLANT
SCREW ASNIS 90MM (Screw) ×4 IMPLANT
SCREW ASNIS 95MM (Screw) ×2 IMPLANT
STAPLER SKIN PROX 35W (STAPLE) ×2 IMPLANT
SUT VIC AB 0 CT1 36 (SUTURE) ×2 IMPLANT
SUT VIC AB 2-0 CT1 27 (SUTURE) ×1
SUT VIC AB 2-0 CT1 TAPERPNT 27 (SUTURE) ×1 IMPLANT
SYR 30ML LL (SYRINGE) ×2 IMPLANT
SYR 5ML LL (SYRINGE) ×2 IMPLANT
TAPE CLOTH 3X10 WHT NS LF (GAUZE/BANDAGES/DRESSINGS) ×2 IMPLANT
WASHER SCREW MATTA SS 13.0X1.5 (Washer) ×6 IMPLANT

## 2020-04-18 NOTE — Transfer of Care (Signed)
Immediate Anesthesia Transfer of Care Note  Patient: Cynthia Ritter  Procedure(s) Performed: CANNULATED HIP PINNING (Left Hip)  Patient Location: PACU  Anesthesia Type:Spinal  Level of Consciousness: awake  Airway & Oxygen Therapy: Patient connected to face mask oxygen  Post-op Assessment: Post -op Vital signs reviewed and stable  Post vital signs: stable  Last Vitals:  Vitals Value Taken Time  BP 91/63 04/18/20 1724  Temp 36.3 C 04/18/20 1724  Pulse 86 04/18/20 1727  Resp 13 04/18/20 1727  SpO2 100 % 04/18/20 1727  Vitals shown include unvalidated device data.  Last Pain:  Vitals:   04/18/20 1406  TempSrc: Temporal  PainSc: 0-No pain      Patients Stated Pain Goal: 0 (91/98/02 2179)  Complications: No complications documented.

## 2020-04-18 NOTE — Anesthesia Preprocedure Evaluation (Signed)
Anesthesia Evaluation  Patient identified by MRN, date of birth, ID band Patient awake    Reviewed: Allergy & Precautions, NPO status , Patient's Chart, lab work & pertinent test results  Airway Mallampati: II  TM Distance: >3 FB Neck ROM: Full    Dental no notable dental hx. (+) Upper Dentures, Lower Dentures   Pulmonary neg pulmonary ROS, former smoker,    Pulmonary exam normal breath sounds clear to auscultation       Cardiovascular hypertension, negative cardio ROS Normal cardiovascular exam Rhythm:Regular Rate:Normal     Neuro/Psych PSYCHIATRIC DISORDERS Depression negative neurological ROS     GI/Hepatic negative GI ROS, Neg liver ROS,   Endo/Other  negative endocrine ROS  Renal/GU negative Renal ROS Bladder dysfunction      Musculoskeletal  (+) Arthritis ,   Abdominal   Peds negative pediatric ROS (+)  Hematology negative hematology ROS (+)   Anesthesia Other Findings   Reproductive/Obstetrics negative OB ROS                             Anesthesia Physical  Anesthesia Plan  ASA: II  Anesthesia Plan: Spinal   Post-op Pain Management:    Induction: Intravenous  PONV Risk Score and Plan:   Airway Management Planned: Nasal Cannula  Additional Equipment:   Intra-op Plan:   Post-operative Plan:   Informed Consent: I have reviewed the patients History and Physical, chart, labs and discussed the procedure including the risks, benefits and alternatives for the proposed anesthesia with the patient or authorized representative who has indicated his/her understanding and acceptance.     Dental advisory given  Plan Discussed with: CRNA  Anesthesia Plan Comments:         Anesthesia Quick Evaluation

## 2020-04-18 NOTE — Progress Notes (Signed)
Initial Nutrition Assessment  DOCUMENTATION CODES:   Obesity unspecified  INTERVENTION:   Ensure Enlive po BID, each supplement provides 350 kcal and 20 grams of protein  MVI daily  NUTRITION DIAGNOSIS:   Increased nutrient needs related to hip fracture as evidenced by estimated needs.  GOAL:   Patient will meet greater than or equal to 90% of their needs  MONITOR:   PO intake,Supplement acceptance,Labs,Weight trends,Skin,I & O's  REASON FOR ASSESSMENT:   Consult Hip fracture protocol  ASSESSMENT:   86 y/o female with h/o HTN, depression and incontinence who is admitted with hip fracture after fall  RD working remotely.  Spoke with pt via phone. Pt reports good appetite and oral intake at baseline. Pt reports that a typical day for her would include 2-3 cups of coffee and a honey bun for breakfast, a sandwich for lunch (loves grilled cheese with ham and tomato) and a home cooked meal for dinner that she prepares for her husband and son (reports her big meal in at dinner). Pt reports that she ate 100% of her dinner last night. Pt is NPO today for scheduled hip fracture repair. RD discussed with pt the importance of adequate nutrition needed for post-op healing. Pt is willing to drink vanilla or chocolate supplements in hospital. RD will add supplements and MVI to help pt meet her estimated needs. Per chart, pt is down 8lbs(4%) in two months; this is not significant.   Medications reviewed and include: zofran, cefazolin  Labs reviewed:   NUTRITION - FOCUSED PHYSICAL EXAM: Unable to perform at this time   Diet Order:   Diet Order            Diet NPO time specified  Diet effective now                EDUCATION NEEDS:   Education needs have been addressed  Skin:  Skin Assessment: Reviewed RN Assessment  Last BM:  12/12  Height:   Ht Readings from Last 1 Encounters:  04/16/20 5\' 3"  (1.6 m)    Weight:   Wt Readings from Last 1 Encounters:  04/16/20 88.9  kg    Ideal Body Weight:  52.27 kg  BMI:  Body mass index is 34.72 kg/m.  Estimated Nutritional Needs:   Kcal:  1700-1900kcal/day  Protein:  85-95g/day  Fluid:  1.5L/day  Koleen Distance MS, RD, LDN Please refer to [facility] for RD and/or RD on-call/weekend/after hours pager

## 2020-04-18 NOTE — Op Note (Signed)
DATE OF SURGERY: 04/18/2020  PREOPERATIVE DIAGNOSIS: Left valgus impacted femoral neck fracture  POSTOPERATIVE DIAGNOSIS: Left valgus impacted femoral neck fracture  PROCEDURE: Percutaneous pinning of Left femoral neck fracture  SURGEON: Cato Mulligan, MD  ASSISTANTS: none  EBL: 50 cc  COMPONENTS:  Stryker 6.16mm cannulated screws x 3 with washers (78mm, 45mm, 59mm)   INDICATIONS: [name] is a 86 y.o. female who sustained a valgus impacted femoral neck fracture after a fall. Risks and benefits of percutaneous pinning were explained to the patient and family. Risks include but are not limited to bleeding, infection, injury to tissues, nerves, vessels, DVT/PE, malunion/nonunion, hardware failure, and risks of anesthesia. The patient and family understand these risks, have completed an informed consent, and wish to proceed.   PROCEDURE:  The patient was brought into the operating room. After administering spinal anesthesia, the patient was placed in the supine position on the Hana table. The uninjured leg was extended while the injured lower extremity was placed in a neutral position with care taken to not displace the fracture during positioning. The lateral aspects of the operative hip and thigh were prepped with ChloraPrep solution before being draped sterilely. IV antibiotics were administered. A timeout was performed to verify the appropriate surgical site, patient, and procedure.   The greater trochanter was identified and an approximately 5 cm incision was made over the lateral aspect of the proximal femur. The incision was carried down through the subcutaneous tissues to expose the IT band. This was split at the proximal portion of the incision and the vastus lateralis was split in line with its fibers. The lateral aspect of the femur was cleared of soft tissue. Under fluoroscopic guidance, a guidewire was placed along the inferior aspect of the femoral neck into the head while  ensuring the start point on the lateral cortex was not below the level of the lesser trochanter. A parallel guide was used to place two additional guidepins (superior posterior and superior anterior). Position of all pins was verified fluoroscopically in both the AP and lateral views. A measuring device was used the measure appropriate screw length. The guidepins were drilled with a 3.41mm drill. Appropriately sized screws were advanced starting with the inferior screw, then superior posterior, then superior anterior. Screws were sequentially tightened. Hardware position and bony alignment was confirmed fluoroscopically with AP and lateral views.   The wounds were irrigated thoroughly with sterile saline solution. The IT band was closed with 0-Vicryl. The subcutaneous tissues were closed using 2-0 Vicryl interrupted sutures. The skin was closed using staples. Sterile occlusive dressing was applied. The patient was then transferred to the recovery room in satisfactory condition after tolerating the procedure well.  POSTOPERATIVE PLAN: The patient will be WBAT on the operative extremity. Lovenox 40mg /day x 4 weeks to start on POD#1. Ancef x 24 hours. PT/OT on POD#1.

## 2020-04-18 NOTE — Progress Notes (Signed)
PROGRESS NOTE    Cynthia Ritter  NGE:[id number] DOB: 04-17-1933 DOA: 04/16/2020 PCP: Sallee Lange, NP   Chief Complaint  Patient presents with  . Fall    Brief Narrative: 86 year old female w/ depression arthritis, hypertension, urinary incontinence seen at the med Center in [place] with a left hip pain following a mechanical fall and found to have a left femoral neck fracture and admitted to [facility] for operative intervention  Subjective:  Had a fall Monday 11.30 am after changing bulb at home, no LOC and no other injury No pain, it is controlled Awaiting for OR today   Assessment & Plan:  Left  Femoral neck fracture from fall- for OR today.  PT OT pain control DVT prophylaxis as per orthopedics postop.  N.p.o. for surgery.  Preop eval refer to previous note  Hypertension: BP is controlled on losartan.  Monitor blood pressure  Depression: Mood is stable continue Prozac  Incontinence: Monitor closely.  Patient takes Myrbetriq and Detrol at home.  Morbid obesity Body mass index is 34.72 kg/m.  Nutrition: Diet Order            Diet NPO time specified  Diet effective now                 DVT prophylaxis: SCD, chemical prophylaxis pending OR Code Status:   Code Status: Full Code  Family Communication: plan of care discussed with patient at bedside.  Status is: Inpatient Remains inpatient appropriate because:Ongoing active pain requiring inpatient pain management and Inpatient level of care appropriate due to severity of illness  Dispo: The patient is from: Home w/ son/husband              Anticipated d/c is to: SNF              Anticipated d/c date is: 2 days              Patient currently is not medically stable to d/c.   Consultants:see note  Procedures:see note  Culture/Microbiology No results found for: SDES, SPECREQUEST, CULT, REPTSTATUS  Other culture-see note  Medications: Scheduled Meds: . fesoterodine  8 mg Oral Daily  . FLUoxetine   30 mg Oral Daily  . fluticasone  2 spray Each Nare Daily  . gabapentin  100 mg Oral BID  . losartan  25 mg Oral Daily  . mirabegron ER  25 mg Oral Daily  . mupirocin ointment  1 application Nasal BID  . ondansetron (ZOFRAN) IV  4 mg Intravenous Once   Continuous Infusions: .  ceFAZolin (ANCEF) IV      Antimicrobials: Anti-infectives (From admission, onward)   Start     Dose/Rate Route Frequency Ordered Stop   04/17/20 1500  ceFAZolin (ANCEF) IVPB 2g/100 mL premix        2 g 200 mL/hr over 30 Minutes Intravenous Once 04/16/20 2144       Objective: Vitals: Today's Vitals   04/18/20 0043 04/18/20 0523 04/18/20 0728 04/18/20 0759  BP: 116/72 122/70  (!) 120/58  Pulse: 67 60  62  Resp: 16 17  18   Temp: 97.7 F (36.5 C) (!) 97.5 F (36.4 C)  98.1 F (36.7 C)  TempSrc:    Oral  SpO2: 95% 97%  97%  Weight:      Height:      PainSc:   0-No pain     Intake/Output Summary (Last 24 hours) at 04/18/2020 0839 Last data filed at 04/18/2020 0537 Gross per 24 hour  Intake --  Output 200 ml  Net -200 ml   Filed Weights   04/16/20 1639  Weight: 88.9 kg   Weight change:   Intake/Output from previous day: 12/14 0701 - 12/15 0700 In: -  Out: 200 [Urine:200] Intake/Output this shift: No intake/output data recorded.  Examination: General exam: AAOx3 ,NAD, weak appearing. HEENT:Oral mucosa moist, Ear/Nose WNL grossly,dentition normal. Respiratory system: bilaterally clear,no wheezing or crackles,no use of accessory muscle, non tender. Cardiovascular system: S1 & S2 +, regular, No JVD. Gastrointestinal system: Abdomen soft, NT,ND, BS+. Nervous System:Alert, awake, moving extremities and grossly nonfocal Extremities: No edema, distal peripheral pulses palpable. Left hip tender Skin: No rashes,no icterus. MSK: Normal muscle bulk,tone, power  Data Reviewed: I have personally reviewed following labs and imaging studies CBC: Recent Labs  Lab 04/16/20 1648 04/18/20 0547  WBC  12.2* 7.5  NEUTROABS 9.7* 4.8  HGB 14.8 13.1  HCT 45.5 41.2  MCV 94.2 95.8  PLT 216 093   Basic Metabolic Panel: Recent Labs  Lab 04/16/20 1648 04/18/20 0547  NA 137 141  K 3.7 4.3  CL 104 106  CO2 22 26  GLUCOSE 88 110*  BUN 22 22  CREATININE 0.94 0.99  CALCIUM 9.1 8.6*   GFR: Estimated Creatinine Clearance: 43.9 mL/min (by C-G formula based on SCr of 0.99 mg/dL). Liver Function Tests: No results for input(s): AST, ALT, ALKPHOS, BILITOT, PROT, ALBUMIN in the last 168 hours. No results for input(s): LIPASE, AMYLASE in the last 168 hours. No results for input(s): AMMONIA in the last 168 hours. Coagulation Profile: Recent Labs  Lab 04/16/20 1657  INR 1.0   Cardiac Enzymes: No results for input(s): CKTOTAL, CKMB, CKMBINDEX, TROPONINI in the last 168 hours. BNP (last 3 results) No results for input(s): PROBNP in the last 8760 hours. HbA1C: No results for input(s): HGBA1C in the last 72 hours. CBG: No results for input(s): GLUCAP in the last 168 hours. Lipid Profile: No results for input(s): CHOL, HDL, LDLCALC, TRIG, CHOLHDL, LDLDIRECT in the last 72 hours. Thyroid Function Tests: No results for input(s): TSH, T4TOTAL, FREET4, T3FREE, THYROIDAB in the last 72 hours. Anemia Panel: No results for input(s): VITAMINB12, FOLATE, FERRITIN, TIBC, IRON, RETICCTPCT in the last 72 hours. Sepsis Labs: No results for input(s): PROCALCITON, LATICACIDVEN in the last 168 hours.  Recent Results (from the past 240 hour(s))  Resp Panel by RT-PCR (Flu A&B, Covid) Nasopharyngeal Swab     Status: None   Collection Time: 04/16/20  4:48 PM   Specimen: Nasopharyngeal Swab; Nasopharyngeal(NP) swabs in vial transport medium  Result Value Ref Range Status   SARS Coronavirus 2 by RT PCR NEGATIVE NEGATIVE Final    Comment: (NOTE) SARS-CoV-2 target nucleic acids are NOT DETECTED.  The SARS-CoV-2 RNA is generally detectable in upper respiratory specimens during the acute phase of infection.  The lowest concentration of SARS-CoV-2 viral copies this assay can detect is 138 copies/mL. A negative result does not preclude SARS-Cov-2 infection and should not be used as the sole basis for treatment or other patient management decisions. A negative result may occur with  improper specimen collection/handling, submission of specimen other than nasopharyngeal swab, presence of viral mutation(s) within the areas targeted by this assay, and inadequate number of viral copies(<138 copies/mL). A negative result must be combined with clinical observations, patient history, and epidemiological information. The expected result is Negative.  Fact Sheet for Patients:  EntrepreneurPulse.com.au  Fact Sheet for Healthcare Providers:  IncredibleEmployment.be  This test is no t yet approved  or cleared by the [place] and  has been authorized for detection and/or diagnosis of SARS-CoV-2 by FDA under an Emergency Use Authorization (EUA). This EUA will remain  in effect (meaning this test can be used) for the duration of the COVID-19 declaration under Section 564(b)(1) of the Act, 21 U.S.C.section 360bbb-3(b)(1), unless the authorization is terminated  or revoked sooner.       Influenza A by PCR NEGATIVE NEGATIVE Final   Influenza B by PCR NEGATIVE NEGATIVE Final    Comment: (NOTE) The Xpert Xpress SARS-CoV-2/FLU/RSV plus assay is intended as an aid in the diagnosis of influenza from Nasopharyngeal swab specimens and should not be used as a sole basis for treatment. Nasal washings and aspirates are unacceptable for Xpert Xpress SARS-CoV-2/FLU/RSV testing.  Fact Sheet for Patients: EntrepreneurPulse.com.au  Fact Sheet for Healthcare Providers: IncredibleEmployment.be  This test is not yet approved or cleared by the [place] FDA and has been authorized for detection and/or diagnosis of SARS-CoV-2 by FDA  under an Emergency Use Authorization (EUA). This EUA will remain in effect (meaning this test can be used) for the duration of the COVID-19 declaration under Section 564(b)(1) of the Act, 21 U.S.C. section 360bbb-3(b)(1), unless the authorization is terminated or revoked.  Performed at [facility], [address] [place], [place] [address]   Surgical PCR screen     Status: None   Collection Time: 04/17/20  4:33 AM   Specimen: Nasal Mucosa; Nasal Swab  Result Value Ref Range Status   MRSA, PCR NEGATIVE NEGATIVE Final   Staphylococcus aureus NEGATIVE NEGATIVE Final    Comment: (NOTE) The Xpert SA Assay (FDA approved for NASAL specimens in patients 48 years of age and older), is one component of a comprehensive surveillance program. It is not intended to diagnose infection nor to guide or monitor treatment. Performed at [facility], [address], [place], [place] [address]      Radiology Studies: DG Wrist Complete Left  Result Date: 04/16/2020 CLINICAL DATA:  Status post fall EXAM: LEFT WRIST - COMPLETE 3+ VIEW COMPARISON:  None. FINDINGS: No fracture or malalignment. Mild degenerative change at the STT interval and first [facility] joint. IMPRESSION: No acute osseous abnormality. Electronically Signed   By: Donavan Foil M.D.   On: 04/16/2020 15:52   DG Chest Portable 1 View  Result Date: 04/16/2020 CLINICAL DATA:  Preoperative EXAM: PORTABLE CHEST 1 VIEW COMPARISON:  05/28/2013 FINDINGS: The heart size and mediastinal contours are within normal limits. Minimal diffuse interstitial pulmonary opacity. The visualized skeletal structures are unremarkable. IMPRESSION: Minimal diffuse interstitial pulmonary opacity, most likely minimal edema. No focal airspace opacity. Electronically Signed   By: Eddie Candle M.D.   On: 04/16/2020 17:46   DG Hip Unilat With Pelvis 2-3 Views Left  Result Date: 04/16/2020 CLINICAL DATA:  Fall EXAM: DG HIP (WITH OR WITHOUT PELVIS) 2-3V  LEFT COMPARISON:  None. FINDINGS: Osteopenia. There is an angulated and impacted subcapital fracture of the left femoral neck. No other evidence of pelvic fracture or fracture of the proximal right femur seen in frontal view only. IMPRESSION: There is an angulated and impacted subcapital fracture of the left femoral neck. Electronically Signed   By: Eddie Candle M.D.   On: 04/16/2020 15:55     LOS: 2 days   Antonieta Pert, MD [facility]  04/18/2020, 8:39 AM

## 2020-04-18 NOTE — Anesthesia Procedure Notes (Signed)
Spinal  Start time: 04/18/2020 4:20 PM End time: 04/18/2020 4:21 PM Staffing Performed: resident/CRNA  Anesthesiologist: Piscitello, Precious Haws, MD Resident/CRNA: Aline Brochure, CRNA Preanesthetic Checklist Completed: patient identified, IV checked, site marked, risks and benefits discussed, surgical consent, monitors and equipment checked and pre-op evaluation Spinal Block Patient position: sitting Prep: ChloraPrep and site prepped and draped Patient monitoring: heart rate, continuous pulse ox and blood pressure Approach: midline Location: L3-4 Injection technique: single-shot Needle Needle type: Whitacre  Needle gauge: 25 G

## 2020-04-19 ENCOUNTER — Encounter: Payer: Self-pay | Admitting: Orthopedic Surgery

## 2020-04-19 DIAGNOSIS — S72001A Fracture of unspecified part of neck of right femur, initial encounter for closed fracture: Secondary | ICD-10-CM

## 2020-04-19 LAB — CBC
HCT: 37.4 % (ref 36.0–46.0)
Hemoglobin: 12.4 g/dL (ref 12.0–15.0)
MCH: 31.1 pg (ref 26.0–34.0)
MCHC: 33.2 g/dL (ref 30.0–36.0)
MCV: 93.7 fL (ref 80.0–100.0)
Platelets: 204 10*3/uL (ref 150–400)
RBC: 3.99 MIL/uL (ref 3.87–5.11)
RDW: 14.2 % (ref 11.5–15.5)
WBC: 7.5 10*3/uL (ref 4.0–10.5)
nRBC: 0 % (ref 0.0–0.2)

## 2020-04-19 LAB — BASIC METABOLIC PANEL
Anion gap: 8 (ref 5–15)
BUN: 22 mg/dL (ref 8–23)
CO2: 25 mmol/L (ref 22–32)
Calcium: 8 mg/dL — ABNORMAL LOW (ref 8.9–10.3)
Chloride: 108 mmol/L (ref 98–111)
Creatinine, Ser: 0.92 mg/dL (ref 0.44–1.00)
GFR, Estimated: >60 mL/min (ref >60)
Glucose, Bld: 107 mg/dL — ABNORMAL HIGH (ref 70–99)
Potassium: 4.1 mmol/L (ref 3.5–5.1)
Sodium: 141 mmol/L (ref 135–145)

## 2020-04-19 MED ORDER — HYDROCODONE-ACETAMINOPHEN 5-325 MG PO TABS: 1.0000 | ORAL_TABLET | Freq: Four times a day (QID) | ORAL | 0 refills | Status: DC | PRN

## 2020-04-19 MED ORDER — METHOCARBAMOL 1000 MG/10ML IJ SOLN
500.0000 mg | Freq: Four times a day (QID) | INTRAVENOUS | Status: DC | PRN
  Filled 2020-04-19: qty 5

## 2020-04-19 MED ORDER — DOCUSATE SODIUM 100 MG PO CAPS
100.0000 mg | ORAL_CAPSULE | Freq: Two times a day (BID) | ORAL | Status: DC
  Administered 2020-04-19 – 2020-04-21 (×5): 100 mg via ORAL
  Filled 2020-04-19 (×5): qty 1

## 2020-04-19 MED ORDER — CEFAZOLIN SODIUM-DEXTROSE 2-4 GM/100ML-% IV SOLN
2.0000 g | Freq: Four times a day (QID) | INTRAVENOUS | Status: AC
  Administered 2020-04-19 (×2): 2 g via INTRAVENOUS
  Filled 2020-04-19 (×3): qty 100

## 2020-04-19 MED ORDER — ACETAMINOPHEN 500 MG PO TABS
1000.0000 mg | ORAL_TABLET | Freq: Three times a day (TID) | ORAL | Status: DC
  Administered 2020-04-19 – 2020-04-20 (×5): 1000 mg via ORAL
  Filled 2020-04-19 (×6): qty 2

## 2020-04-19 MED ORDER — ONDANSETRON HCL 4 MG PO TABS: 4.0000 mg | ORAL_TABLET | Freq: Four times a day (QID) | ORAL | Status: DC | PRN

## 2020-04-19 MED ORDER — METHOCARBAMOL 500 MG PO TABS: 500.0000 mg | ORAL_TABLET | Freq: Four times a day (QID) | ORAL | Status: DC | PRN

## 2020-04-19 MED ORDER — OXYCODONE HCL 5 MG PO TABS: 2.5000 mg | ORAL_TABLET | ORAL | Status: DC | PRN

## 2020-04-19 MED ORDER — OXYCODONE HCL 5 MG PO TABS: 5.0000 mg | ORAL_TABLET | ORAL | Status: DC | PRN

## 2020-04-19 MED ORDER — METOCLOPRAMIDE HCL 5 MG/ML IJ SOLN
5.0000 mg | Freq: Three times a day (TID) | INTRAMUSCULAR | Status: DC | PRN
Start: 2020-04-18 — End: 2020-04-21

## 2020-04-19 MED ORDER — SODIUM CHLORIDE 0.9 % IV SOLN: INTRAVENOUS | Status: DC

## 2020-04-19 MED ORDER — ENOXAPARIN SODIUM 40 MG/0.4ML ~~LOC~~ SOLN
40.0000 mg | SUBCUTANEOUS | Status: DC
  Administered 2020-04-19 – 2020-04-21 (×3): 40 mg via SUBCUTANEOUS
  Filled 2020-04-19 (×3): qty 0.4

## 2020-04-19 MED ORDER — FLEET ENEMA 7-19 GM/118ML RE ENEM: 1.0000 | ENEMA | Freq: Once | RECTAL | Status: DC | PRN

## 2020-04-19 MED ORDER — SENNOSIDES-DOCUSATE SODIUM 8.6-50 MG PO TABS
1.0000 | ORAL_TABLET | Freq: Every evening | ORAL | Status: DC | PRN
  Administered 2020-04-20: 09:00:00 1 via ORAL
  Filled 2020-04-19: qty 1

## 2020-04-19 MED ORDER — BISACODYL 10 MG RE SUPP: 10.0000 mg | Freq: Every day | RECTAL | Status: DC | PRN

## 2020-04-19 MED ORDER — KETOROLAC TROMETHAMINE 15 MG/ML IJ SOLN
7.5000 mg | Freq: Four times a day (QID) | INTRAMUSCULAR | Status: AC
  Administered 2020-04-19 (×4): 7.5 mg via INTRAVENOUS
  Filled 2020-04-19 (×4): qty 1

## 2020-04-19 MED ORDER — ONDANSETRON HCL 4 MG/2ML IJ SOLN: 4.0000 mg | Freq: Four times a day (QID) | INTRAMUSCULAR | Status: DC | PRN

## 2020-04-19 MED ORDER — METOCLOPRAMIDE HCL 10 MG PO TABS: 5.0000 mg | ORAL_TABLET | Freq: Three times a day (TID) | ORAL | Status: DC | PRN

## 2020-04-19 MED ORDER — HYDROMORPHONE HCL 1 MG/ML IJ SOLN
0.2500 mg | INTRAMUSCULAR | Status: DC | PRN
Start: 2020-04-19 — End: 2020-04-21

## 2020-04-19 MED ORDER — TRAMADOL HCL 50 MG PO TABS: 50.0000 mg | ORAL_TABLET | Freq: Four times a day (QID) | ORAL | Status: DC | PRN

## 2020-04-19 NOTE — Evaluation (Signed)
Occupational Therapy Evaluation Patient Details Name: Cynthia Ritter MRN: [id number] DOB: 10-12-1932 Today's Date: 04/19/2020    History of Present Illness Cynthia Ritter is a 86 y.o. female undergoing surgical percutaneous pinning of Left femoral neck fracture following a valgus impacted femoral neck fx after a mechanical fall.   Clinical Impression   Pt seen for OT evaluation this date, POD#1 from above surgery. Pt was independent in all ADL/IADL prior to surgery. Pt lives at home with her spouse, who is available 24/7. A daughter and son also live nearby and can assist their mother in her recovery. Their home is a split-level, no STE, but 3 stairs to get to the main living area. Pt reports she is having almost no pain at present. She is moving well, currently requiring CGA for mobility and transfers. Pt instructed in self care skills, falls prevention strategies, home/routines modifications, DME/AE for LB bathing and dressing tasks, and HEP. Pt would benefit from additional instruction in self care skills while hospitalized, with [place] post D/C to assist pt to return to PLOF.     Follow Up Recommendations  Home health OT    Equipment Recommendations  3 in 1 bedside commode    Recommendations for Other Services       Precautions / Restrictions Precautions Precautions: Fall Restrictions Weight Bearing Restrictions: Yes LLE Weight Bearing: Weight bearing as tolerated      Mobility Bed Mobility                    Transfers Overall transfer level: Needs assistance Equipment used: Rolling walker (2 wheeled) Transfers: Sit to/from Stand Sit to Stand: Min guard         General transfer comment: able to follow VCs for hand placement, safe use of walker    Balance Overall balance assessment: Needs assistance Sitting-balance support: Feet unsupported;No upper extremity supported Sitting balance-Leahy Scale: Good     Standing balance support: Bilateral upper extremity  supported;During functional activity Standing balance-Leahy Scale: Good                             ADL either performed or assessed with clinical judgement   ADL Overall ADL's : Needs assistance/impaired Eating/Feeding: Independent                   Lower Body Dressing: Minimal assistance                       Vision Baseline Vision/History: Wears glasses Wears Glasses: Reading only Patient Visual Report: No change from baseline       Perception     Praxis      Pertinent Vitals/Pain Pain Assessment: No/denies pain     Hand Dominance     Extremity/Trunk Assessment Upper Extremity Assessment Upper Extremity Assessment: Overall WFL for tasks assessed   Lower Extremity Assessment Lower Extremity Assessment: LLE deficits/detail       Communication Communication Communication: No difficulties   Cognition Arousal/Alertness: Awake/alert Behavior During Therapy: WFL for tasks assessed/performed Overall Cognitive Status: Within Functional Limits for tasks assessed                                     General Comments       Exercises Other Exercises Other Exercises: educ re: role of OT, POC, DC recs, AE, DME. Transfers,  mobility, LB dressing   Shoulder Instructions      Home Living Family/patient expects to be discharged to:: Private residence Living Arrangements: Spouse/significant other Available Help at Discharge: Available 24 hours/day;Family Type of Home: House Home Access: Level entry     Home Layout: Two level;Able to live on main level with bedroom/bathroom Alternate Level Stairs-Number of Steps: split level home, 3 steps to enter main living area. 1/2 bath on lower level. Bedroom upstairs -- 10 steps.       Bathroom Toilet: Standard Bathroom Accessibility: No   Home Equipment: Walker - standard;Cane - quad   Additional Comments: family plans to rent a hospital bed for living room      Prior  Functioning/Environment Level of Independence: Independent                 OT Problem List: Decreased strength;Impaired balance (sitting and/or standing);Decreased range of motion;Decreased activity tolerance;Decreased coordination;Decreased knowledge of use of DME or AE      OT Treatment/Interventions: Self-care/ADL training;DME and/or AE instruction;Therapeutic activities;Balance training;Therapeutic exercise;Energy conservation;Patient/family education    OT Goals(Current goals can be found in the care plan section) Acute Rehab OT Goals Patient Stated Goal: to get back to normal routine OT Goal Formulation: With patient Time For Goal Achievement: 05/03/20 Potential to Achieve Goals: Good ADL Goals Pt Will Perform Lower Body Dressing: with modified independence Pt Will Transfer to Toilet: with supervision (using LRAD) Pt Will Perform Tub/Shower Transfer: with supervision;shower seat  OT Frequency: Min 1X/week   Barriers to D/C:            Co-evaluation              AM-PAC OT "6 Clicks" Daily Activity     Outcome Measure Help from another person eating meals?: None Help from another person taking care of personal grooming?: None Help from another person toileting, which includes using toliet, bedpan, or urinal?: A Little Help from another person bathing (including washing, rinsing, drying)?: A Little Help from another person to put on and taking off regular upper body clothing?: None Help from another person to put on and taking off regular lower body clothing?: A Little 6 Click Score: 21   End of Session Equipment Utilized During Treatment: Rolling walker  Activity Tolerance: Patient tolerated treatment well Patient left: in chair;with family/visitor present;with nursing/sitter in room;with call bell/phone within reach  OT Visit Diagnosis: Unsteadiness on feet (R26.81);Muscle weakness (generalized) (M62.81)                Time: 0349-1791 OT Time Calculation  (min): 28 min Charges:  OT General Charges $OT Visit: 1 Visit OT Evaluation $OT Eval Low Complexity: 1 Low OT Treatments $Self Care/Home Management : 23-37 mins  Josiah Lobo, PhD, MS, OTR/L ascom [phone] 04/19/20, 11:30 AM

## 2020-04-19 NOTE — Progress Notes (Signed)
  Subjective: 1 Day Post-Op Procedure(s) (LRB): CANNULATED HIP PINNING (Left) Patient reports pain as moderate.   Patient is well, and has had no acute complaints or problems Plan is to go Rehab after hospital stay. Negative for chest pain and shortness of breath Fever: no Gastrointestinal: Negative for nausea and vomiting  Objective: Vital signs in last 24 hours: Temp:  [97 F (36.1 C)-98.8 F (37.1 C)] 98 F (36.7 C) (12/16 0453) Pulse Rate:  [58-83] 63 (12/16 0453) Resp:  [12-20] 20 (12/16 0453) BP: (91-148)/(53-98) 117/60 (12/16 0453) SpO2:  [92 %-100 %] 94 % (12/16 0453) Weight:  [88 kg] 88 kg (12/15 1406)  Intake/Output from previous day:  Intake/Output Summary (Last 24 hours) at 04/19/2020 0659 Last data filed at 04/19/2020 0519 Gross per 24 hour  Intake 100 ml  Output 950 ml  Net -850 ml    Intake/Output this shift: Total I/O In: -  Out: 900 [Urine:900]  Labs: Recent Labs    04/16/20 1648 04/18/20 0547 04/19/20 0435  HGB 14.8 13.1 12.4   Recent Labs    04/18/20 0547 04/19/20 0435  WBC 7.5 7.5  RBC 4.30 3.99  HCT 41.2 37.4  PLT 213 204   Recent Labs    04/18/20 0547 04/19/20 0435  NA 141 141  K 4.3 4.1  CL 106 108  CO2 26 25  BUN 22 22  CREATININE 0.99 0.92  GLUCOSE 110* 107*  CALCIUM 8.6* 8.0*   Recent Labs    04/16/20 1657  INR 1.0     EXAM General - Patient is Alert and Oriented Extremity - Neurovascular intact Sensation intact distally Dorsiflexion/Plantar flexion intact Compartment soft Dressing/Incision - clean, dry, no drainage Motor Function - intact, moving foot and toes well on exam.   Past Medical History:  Diagnosis Date  . Arthritis    left heel/ knees and ankle, wears a knee brace occasionally  . Depression   . Hot flashes   . Hypertension   . Incontinence    URINARY  . Motion sickness    planes, back seat-cars  . Seasonal allergies     Assessment/Plan: 1 Day Post-Op Procedure(s) (LRB): CANNULATED  HIP PINNING (Left) Principal Problem:   Femoral neck fracture (HCC) Active Problems:   Hypertension   Depression   Incontinence  Estimated body mass index is 34.37 kg/m as calculated from the following:   Height as of this encounter: 5\' 3"  (1.6 m).   Weight as of this encounter: 88 kg. Advance diet Up with therapy D/C IV fluids  DVT Prophylaxis - Lovenox, Foot Pumps and TED hose Weight-Bearing as tolerated to left leg  Reche Dixon, PA-C Orthopaedic Surgery 04/19/2020, 6:59 AM

## 2020-04-19 NOTE — Progress Notes (Signed)
PROGRESS NOTE    Cynthia Ritter  TDD:[id number] DOB: [birth year] DOA: 04/16/2020 PCP: Sallee Lange, NP   Chief Complaint  Patient presents with  . Fall    Brief Narrative: 86 year old female w/ depression arthritis, hypertension, urinary incontinence seen at the med Center in [place] with a left hip pain following a mechanical fall and found to have a left femoral neck fracture and admitted to [facility] for operative intervention  Subjective:  Seen this morning pain is controlled patient resting comfortably.  No new complaint.   Assessment & Plan:  Left  Femoral neck fracture from fall-first left hip pinning per orthopedic.  Appreciate input.  Increase activity diet up with PT OT today and DC IV fluids.  Lovenox for DVT prophylaxis and weightbearing as tolerated.    Hypertension: BP is controlled on losartan.   Depression: Mood is stable on Prozac Incontinence: Continue home Myrbetriq and Detrol at home. Morbid obesity Body mass index is 34.72 kg/m: She will benefit with weight loss and healthy lifestyle.  Nutrition: Diet Order            Diet regular Room service appropriate? Yes; Fluid consistency: Thin  Diet effective now                 DVT prophylaxis: enoxaparin (LOVENOX) injection 40 mg Start: 04/19/20 0815 SCDs Start: 04/19/20 0728S Code Status:   Code Status: Full Code  Family Communication: plan of care discussed with patient at bedside.  Status is: Inpatient Remains inpatient appropriate because:Ongoing active pain requiring inpatient pain management and Inpatient level of care appropriate due to severity of illness  Dispo: The patient is from: Home w/ son/husband              Anticipated d/c is to: SNF vs HHC              Anticipated d/c date is: 1-2 DAYS              Patient currently is not medically stable to d/c.   Consultants:see note  Procedures:see note  Culture/Microbiology No results found for: SDES, SPECREQUEST, CULT,  REPTSTATUS  Other culture-see note  Medications: Scheduled Meds: . acetaminophen  1,000 mg Oral Q8H  . docusate sodium  100 mg Oral BID  . enoxaparin (LOVENOX) injection  40 mg Subcutaneous Q24H  . feeding supplement  237 mL Oral BID BM  . fesoterodine  8 mg Oral Daily  . FLUoxetine  30 mg Oral Daily  . fluticasone  2 spray Each Nare Daily  . gabapentin  100 mg Oral BID  . ketorolac  7.5 mg Intravenous Q6H  . losartan  25 mg Oral Daily  . mirabegron ER  25 mg Oral Daily  . multivitamin with minerals  1 tablet Oral Daily  . mupirocin ointment  1 application Nasal BID   Continuous Infusions: . sodium chloride 75 mL/hr at 04/19/20 1247  .  ceFAZolin (ANCEF) IV 2 g (04/19/20 1254)  . methocarbamol (ROBAXIN) IV      Antimicrobials: Anti-infectives (From admission, onward)   Start     Dose/Rate Route Frequency Ordered Stop   04/19/20 0815  ceFAZolin (ANCEF) IVPB 2g/100 mL premix        2 g 200 mL/hr over 30 Minutes Intravenous Every 6 hours 04/19/20 0727 04/20/20 0214   04/17/20 1500  ceFAZolin (ANCEF) IVPB 2g/100 mL premix        2 g 200 mL/hr over 30 Minutes Intravenous Once 04/16/20 2144 04/18/20 1639  Objective: Vitals: Today's Vitals   04/19/20 0453 04/19/20 0729 04/19/20 0915 04/19/20 1116  BP: 117/60 110/74  123/63  Pulse: 63 68  79  Resp: 20 15  16   Temp: 98 F (36.7 C) 98.5 F (36.9 C)  97.9 F (36.6 C)  TempSrc: Oral     SpO2: 94% 93%  95%  Weight:      Height:      PainSc:   0-No pain     Intake/Output Summary (Last 24 hours) at 04/19/2020 1416 Last data filed at 04/19/2020 1411 Gross per 24 hour  Intake 580 ml  Output 950 ml  Net -370 ml   Filed Weights   04/16/20 1639 04/18/20 1406  Weight: 88.9 kg 88 kg   Weight change:   Intake/Output from previous day: 12/15 0701 - 12/16 0700 In: 100 [IV Piggyback:100] Out: 950 [Urine:900; Blood:50] Intake/Output this shift: Total I/O In: 480 [P.O.:480] Out: -   Examination: General exam: AAOX3  , NAD, weak appearing. HEENT:Oral mucosa moist, Ear/Nose WNL grossly, dentition normal. Respiratory system: bilaterally CLEAR,no wheezing or crackles,no use of accessory muscle Cardiovascular system: S1 & S2 +, No JVD,. Gastrointestinal system: Abdomen soft, NT,ND, BS+ Nervous System:Alert, awake, moving extremities and grossly nonfocal Extremities: No edema, distal peripheral pulses palpable left hip surgical site with a small dressing- c/d/i.  Skin: No rashes,no icterus. MSK: Normal muscle bulk,tone, power  Data Reviewed: I have personally reviewed following labs and imaging studies CBC: Recent Labs  Lab 04/16/20 1648 04/18/20 0547 04/19/20 0435  WBC 12.2* 7.5 7.5  NEUTROABS 9.7* 4.8  --   HGB 14.8 13.1 12.4  HCT 45.5 41.2 37.4  MCV 94.2 95.8 93.7  PLT 216 213 599   Basic Metabolic Panel: Recent Labs  Lab 04/16/20 1648 04/18/20 0547 04/19/20 0435  NA 137 141 141  K 3.7 4.3 4.1  CL 104 106 108  CO2 22 26 25   GLUCOSE 88 110* 107*  BUN 22 22 22   CREATININE 0.94 0.99 0.92  CALCIUM 9.1 8.6* 8.0*   GFR: Estimated Creatinine Clearance: 47 mL/min (by C-G formula based on SCr of 0.92 mg/dL). Liver Function Tests: No results for input(s): AST, ALT, ALKPHOS, BILITOT, PROT, ALBUMIN in the last 168 hours. No results for input(s): LIPASE, AMYLASE in the last 168 hours. No results for input(s): AMMONIA in the last 168 hours. Coagulation Profile: Recent Labs  Lab 04/16/20 1657  INR 1.0   Cardiac Enzymes: No results for input(s): CKTOTAL, CKMB, CKMBINDEX, TROPONINI in the last 168 hours. BNP (last 3 results) No results for input(s): PROBNP in the last 8760 hours. HbA1C: No results for input(s): HGBA1C in the last 72 hours. CBG: No results for input(s): GLUCAP in the last 168 hours. Lipid Profile: No results for input(s): CHOL, HDL, LDLCALC, TRIG, CHOLHDL, LDLDIRECT in the last 72 hours. Thyroid Function Tests: No results for input(s): TSH, T4TOTAL, FREET4, T3FREE,  THYROIDAB in the last 72 hours. Anemia Panel: No results for input(s): VITAMINB12, FOLATE, FERRITIN, TIBC, IRON, RETICCTPCT in the last 72 hours. Sepsis Labs: No results for input(s): PROCALCITON, LATICACIDVEN in the last 168 hours.  Recent Results (from the past 240 hour(s))  Resp Panel by RT-PCR (Flu A&B, Covid) Nasopharyngeal Swab     Status: None   Collection Time: 04/16/20  4:48 PM   Specimen: Nasopharyngeal Swab; Nasopharyngeal(NP) swabs in vial transport medium  Result Value Ref Range Status   SARS Coronavirus 2 by RT PCR NEGATIVE NEGATIVE Final    Comment: (NOTE) SARS-CoV-2 target  nucleic acids are NOT DETECTED.  The SARS-CoV-2 RNA is generally detectable in upper respiratory specimens during the acute phase of infection. The lowest concentration of SARS-CoV-2 viral copies this assay can detect is 138 copies/mL. A negative result does not preclude SARS-Cov-2 infection and should not be used as the sole basis for treatment or other patient management decisions. A negative result may occur with  improper specimen collection/handling, submission of specimen other than nasopharyngeal swab, presence of viral mutation(s) within the areas targeted by this assay, and inadequate number of viral copies(<138 copies/mL). A negative result must be combined with clinical observations, patient history, and epidemiological information. The expected result is Negative.  Fact Sheet for Patients:  EntrepreneurPulse.com.au  Fact Sheet for Healthcare Providers:  IncredibleEmployment.be  This test is no t yet approved or cleared by the [place] FDA and  has been authorized for detection and/or diagnosis of SARS-CoV-2 by FDA under an Emergency Use Authorization (EUA). This EUA will remain  in effect (meaning this test can be used) for the duration of the COVID-19 declaration under Section 564(b)(1) of the Act, 21 U.S.C.section 360bbb-3(b)(1), unless the  authorization is terminated  or revoked sooner.       Influenza A by PCR NEGATIVE NEGATIVE Final   Influenza B by PCR NEGATIVE NEGATIVE Final    Comment: (NOTE) The Xpert Xpress SARS-CoV-2/FLU/RSV plus assay is intended as an aid in the diagnosis of influenza from Nasopharyngeal swab specimens and should not be used as a sole basis for treatment. Nasal washings and aspirates are unacceptable for Xpert Xpress SARS-CoV-2/FLU/RSV testing.  Fact Sheet for Patients: EntrepreneurPulse.com.au  Fact Sheet for Healthcare Providers: IncredibleEmployment.be  This test is not yet approved or cleared by the [place] FDA and has been authorized for detection and/or diagnosis of SARS-CoV-2 by FDA under an Emergency Use Authorization (EUA). This EUA will remain in effect (meaning this test can be used) for the duration of the COVID-19 declaration under Section 564(b)(1) of the Act, 21 U.S.C. section 360bbb-3(b)(1), unless the authorization is terminated or revoked.  Performed at [facility], [address] [place], [place] [address]   Surgical PCR screen     Status: None   Collection Time: 04/17/20  4:33 AM   Specimen: Nasal Mucosa; Nasal Swab  Result Value Ref Range Status   MRSA, PCR NEGATIVE NEGATIVE Final   Staphylococcus aureus NEGATIVE NEGATIVE Final    Comment: (NOTE) The Xpert SA Assay (FDA approved for NASAL specimens in patients 70 years of age and older), is one component of a comprehensive surveillance program. It is not intended to diagnose infection nor to guide or monitor treatment. Performed at [facility], [address] [place], [place] [address]      Radiology Studies: DG HIP OPERATIVE UNILAT W OR W/O PELVIS LEFT  Result Date: 04/18/2020 CLINICAL DATA:  Hip fracture EXAM: OPERATIVE left HIP (WITH PELVIS IF PERFORMED) 7 VIEWS TECHNIQUE: Fluoroscopic spot image(s) were submitted for interpretation  post-operatively. COMPARISON:  04/16/2020 FINDINGS: Seven low resolution intraoperative spot views of the left hip. The images were obtained during internal fixation of left femoral neck fracture. The images demonstrate 3 threaded screws fixating the left femoral neck fracture. There is anatomic alignment. IMPRESSION: Intraoperative fluoroscopic assistance provided during internal fixation of left femoral neck fracture. Electronically Signed   By: Donavan Foil M.D.   On: 04/18/2020 19:06     LOS: 3 days   Antonieta Pert, MD [facility]  04/19/2020, 2:16 PM

## 2020-04-19 NOTE — Progress Notes (Signed)
Physical Therapy Treatment Patient Details Name: Cynthia Ritter MRN: [id number] DOB: 1932/05/08 Today's Date: 04/19/2020    History of Present Illness Pt is an 86 yo female s/p percutaneous pinning of L femoral neck fracture after a fall. PMH of depression, HTN, incontinence.    PT Comments    Pt was supine in bed with HOB elevated ~ 30 degrees. She is very talkative and pleasant throughout session. Does require frequent redirecting to stay focus on task. Pt was able to exit R side of bed without physical assistance. Stood and ambulated 75 ft without LOB however once she attempted to turn to return to room she has episode of LOB with therapist intervention to prevent fall. Once balance re-established, no more issues or concerns. Pt was able to return to supine without assistance and performed there ex once in bed. Overall tolerated session well and is progressing well towards all PT goals. Recommend DC to home with [facility] services to continue to progress pt towards PLOF. Pt requested ten AM treatment tomorrow. Will address stairs in future sessions.    Follow Up Recommendations  Home health PT     Equipment Recommendations  3in1 (PT)    Recommendations for Other Services       Precautions / Restrictions Precautions Precautions: Fall Restrictions Weight Bearing Restrictions: Yes LLE Weight Bearing: Weight bearing as tolerated    Mobility  Bed Mobility Overal bed mobility: Needs Assistance Bed Mobility: Supine to Sit     Supine to sit: Supervision;HOB elevated Sit to supine: Supervision;HOB elevated      Transfers Overall transfer level: Needs assistance Equipment used: Rolling walker (2 wheeled) Transfers: Sit to/from Stand Sit to Stand: Min guard         General transfer comment: CGA for safety with vcs for hand placement and improved wt shift  Ambulation/Gait Ambulation/Gait assistance: Min guard Gait Distance (Feet): 75 Feet Assistive device: Rolling walker (2  wheeled) Gait Pattern/deviations: Antalgic;Step-through pattern Gait velocity: decreased   General Gait Details: pt had one episode of LOB with intervention to prevent fall during ambulation however overall safe steady gait kinematics       Balance Overall balance assessment: Needs assistance Sitting-balance support: Feet supported Sitting balance-Leahy Scale: Good     Standing balance support: Bilateral upper extremity supported;During functional activity Standing balance-Leahy Scale: Fair Standing balance comment: reliant on UE support       Cognition Arousal/Alertness: Awake/alert Behavior During Therapy: WFL for tasks assessed/performed Overall Cognitive Status: Within Functional Limits for tasks assessed      General Comments: Pt is A and O and cooperative. is slightly impulsive.      Exercises General Exercises - Lower Extremity Ankle Circles/Pumps: AROM;Both;10 reps Quad Sets: AROM;Both;10 reps Gluteal Sets: AROM;10 reps Heel Slides: AROM;10 reps Hip ABduction/ADduction: AROM Straight Leg Raises: AAROM;10 reps        Pertinent Vitals/Pain Pain Assessment: 0-10 Pain Score: 5  Faces Pain Scale: Hurts little more Pain Location: L hip Pain Descriptors / Indicators: Aching;Grimacing Pain Intervention(s): Limited activity within patient's tolerance;Monitored during session;Premedicated before session;Repositioned    Home Living Family/patient expects to be discharged to:: Private residence Living Arrangements: Spouse/significant other Available Help at Discharge: Available 24 hours/day;Family Type of Home: House Home Access: Level entry   Home Layout: Two level;Able to live on main level with bedroom/bathroom Home Equipment: Walker - standard;Cane - quad Additional Comments: family plans to rent a hospital bed for living room    Prior Function Level of Independence: Independent  PT Goals (current goals can now be found in the care plan section)  Acute Rehab PT Goals Patient Stated Goal: go home PT Goal Formulation: With patient Time For Goal Achievement: 05/03/20 Potential to Achieve Goals: Good Progress towards PT goals: Progressing toward goals    Frequency    BID      PT Plan Current plan remains appropriate       AM-PAC PT "6 Clicks" Mobility   Outcome Measure  Help needed turning from your back to your side while in a flat bed without using bedrails?: A Little Help needed moving from lying on your back to sitting on the side of a flat bed without using bedrails?: A Little Help needed moving to and from a bed to a chair (including a wheelchair)?: A Little Help needed standing up from a chair using your arms (e.g., wheelchair or bedside chair)?: A Little Help needed to walk in hospital room?: A Little Help needed climbing 3-5 steps with a railing? : A Lot 6 Click Score: 17    End of Session Equipment Utilized During Treatment: Gait belt Activity Tolerance: Patient limited by fatigue Patient left: in bed;with call bell/phone within reach;with bed alarm set Nurse Communication: Mobility status PT Visit Diagnosis: Other abnormalities of gait and mobility (R26.89);Muscle weakness (generalized) (M62.81);Difficulty in walking, not elsewhere classified (R26.2);Pain Pain - Right/Left: Left Pain - part of body: Hip     Time: 4401-0272 PT Time Calculation (min) (ACUTE ONLY): 30 min  Charges:  $Gait Training: 8-22 mins $Therapeutic Exercise: 8-22 mins                     Julaine Fusi PTA 04/19/20, 3:50 PM

## 2020-04-19 NOTE — Anesthesia Postprocedure Evaluation (Signed)
Anesthesia Post Note  Patient: Cynthia Ritter  Procedure(s) Performed: CANNULATED HIP PINNING (Left Hip)  Patient location during evaluation: Nursing Unit Anesthesia Type: Spinal Level of consciousness: oriented and awake and alert Pain management: pain level controlled Vital Signs Assessment: post-procedure vital signs reviewed and stable Respiratory status: spontaneous breathing and respiratory function stable Cardiovascular status: blood pressure returned to baseline and stable Postop Assessment: no headache, no backache, no apparent nausea or vomiting and patient able to bend at knees Anesthetic complications: no   No complications documented.   Last Vitals:  Vitals:   04/19/20 0453 04/19/20 0729  BP: 117/60 110/74  Pulse: 63 68  Resp: 20 15  Temp: 36.7 C 36.9 C  SpO2: 94% 93%    Last Pain:  Vitals:   04/19/20 0453  TempSrc: Oral  PainSc:                  Brantley Fling

## 2020-04-19 NOTE — Evaluation (Signed)
Physical Therapy Evaluation Patient Details Name: Cynthia Ritter MRN: [id number] DOB: 05-07-1932 Today's Date: 04/19/2020   History of Present Illness  Pt is an 86 yo female s/p percutaneous pinning of L femoral neck fracture after a fall. PMH of depression, HTN, incontinence.    Clinical Impression  Pt alert, agreeable to PT, reported minimal pain at rest in L hip but did exhibit moderate pain signs/symptoms with mobility. She reported at baseline she is independent, 1 other fall in the last 6 months. Family will be able to assist as needed nearly 24/7.  The patient was able to perform supine exercises with verbal cueing and tactile cues; limited more by pain than weakness. Supine to sit with HOB elevated use of bed rails and CGA. Able to sit at EOB for several minutes, good balance noted. Sit <> stand from EOB and recliner, CGA-minA (for RW steadying) verbal cues to maximize safety. She ambulated ~20ft total in room with CGA, pt did complain of fatigue and "hot flash", pt sweaty. Once returned to sitting, symptoms resolved.  Overall the patient demonstrated deficits (see "PT Problem List") that impede the patient's functional abilities, safety, and mobility and would benefit from skilled PT intervention. Recommendation is HHPT pending pt progress with mobility.     Follow Up Recommendations Home health PT    Equipment Recommendations  3in1 (PT)    Recommendations for Other Services       Precautions / Restrictions Precautions Precautions: Fall Restrictions Weight Bearing Restrictions: Yes LLE Weight Bearing: Weight bearing as tolerated      Mobility  Bed Mobility Overal bed mobility: Needs Assistance Bed Mobility: Supine to Sit     Supine to sit: Min guard;HOB elevated          Transfers Overall transfer level: Needs assistance Equipment used: Rolling walker (2 wheeled) Transfers: Sit to/from Stand Sit to Stand: Min guard         General transfer comment: able to  follow VCs for hand placement with encouragement, pt did tend to want to pull on the walker instead  Ambulation/Gait   Gait Distance (Feet): 12 Feet Assistive device: Rolling walker (2 wheeled)   Gait velocity: decreased   General Gait Details: step to gait pattern with instruction; extended time needed, and pt reported feeling "hot", sweating noted.  Stairs            Wheelchair Mobility    Modified Rankin (Stroke Patients Only)       Balance Overall balance assessment: Needs assistance Sitting-balance support: Feet supported Sitting balance-Leahy Scale: Good     Standing balance support: Bilateral upper extremity supported;During functional activity Standing balance-Leahy Scale: Fair Standing balance comment: reliant on UE support                             Pertinent Vitals/Pain Pain Assessment: Faces Faces Pain Scale: Hurts a little bit Pain Location: L hip Pain Descriptors / Indicators: Aching;Grimacing Pain Intervention(s): Limited activity within patient's tolerance;Monitored during session;Premedicated before session;Repositioned    Home Living Family/patient expects to be discharged to:: Private residence Living Arrangements: Spouse/significant other Available Help at Discharge: Available 24 hours/day;Family Type of Home: House Home Access: Level entry     Home Layout: Two level;Able to live on main level with bedroom/bathroom Home Equipment: Walker - standard;Cane - quad Additional Comments: family plans to rent a hospital bed for living room    Prior Function Level of Independence: Independent  Hand Dominance   Dominant Hand: Right    Extremity/Trunk Assessment   Upper Extremity Assessment Upper Extremity Assessment: Overall WFL for tasks assessed    Lower Extremity Assessment Lower Extremity Assessment: LLE deficits/detail;RLE deficits/detail RLE Deficits / Details: WFLs LLE Deficits / Details: full  assessment deferred due to pain but able to lift with light assist       Communication   Communication: No difficulties  Cognition Arousal/Alertness: Awake/alert Behavior During Therapy: WFL for tasks assessed/performed Overall Cognitive Status: Within Functional Limits for tasks assessed                                        General Comments      Exercises General Exercises - Lower Extremity Ankle Circles/Pumps: AROM;Both;10 reps Quad Sets: AROM;Both;10 reps Heel Slides: AAROM;Strengthening;Left;10 reps Hip ABduction/ADduction: AROM;Strengthening;Left;10 reps Other Exercises Other Exercises: educ re: role of OT, POC, DC recs, AE, DME. Transfers, mobility, LB dressing   Assessment/Plan    PT Assessment Patient needs continued PT services  PT Problem List Decreased strength;Decreased mobility;Decreased range of motion;Decreased knowledge of precautions;Decreased activity tolerance;Decreased balance;Decreased knowledge of use of DME;Pain       PT Treatment Interventions DME instruction;Therapeutic exercise;Balance training;Gait training;Stair training;Neuromuscular re-education;Functional mobility training;Therapeutic activities;Patient/family education    PT Goals (Current goals can be found in the Care Plan section)  Acute Rehab PT Goals Patient Stated Goal: to get back to PLOF PT Goal Formulation: With patient Time For Goal Achievement: 05/03/20 Potential to Achieve Goals: Good    Frequency BID   Barriers to discharge        Co-evaluation               AM-PAC PT "6 Clicks" Mobility  Outcome Measure Help needed turning from your back to your side while in a flat bed without using bedrails?: A Little Help needed moving from lying on your back to sitting on the side of a flat bed without using bedrails?: A Little Help needed moving to and from a bed to a chair (including a wheelchair)?: A Little Help needed standing up from a chair using your  arms (e.g., wheelchair or bedside chair)?: A Little Help needed to walk in hospital room?: A Little Help needed climbing 3-5 steps with a railing? : A Lot 6 Click Score: 17    End of Session Equipment Utilized During Treatment: Gait belt Activity Tolerance: Patient limited by fatigue Patient left: in chair;with chair alarm set;with call bell/phone within reach;with SCD's reapplied Nurse Communication: Mobility status PT Visit Diagnosis: Other abnormalities of gait and mobility (R26.89);Muscle weakness (generalized) (M62.81);Difficulty in walking, not elsewhere classified (R26.2);Pain Pain - Right/Left: Left Pain - part of body: Hip    Time: 0914-1010 PT Time Calculation (min) (ACUTE ONLY): 56 min   Charges:   PT Evaluation $PT Eval Low Complexity: 1 Low PT Treatments $Therapeutic Exercise: 38-52 mins        Lieutenant Diego PT, DPT 2:06 PM,04/19/20

## 2020-04-19 NOTE — Discharge Instructions (Signed)
INSTRUCTIONS AFTER Surgery  o Remove items at home which could result in a fall. This includes throw rugs or furniture in walking pathways o ICE to the affected joint every three hours while awake for 30 minutes at a time, for at least the first 3-5 days, and then as needed for pain and swelling.  Continue to use ice for pain and swelling. You may notice swelling that will progress down to the foot and ankle.  This is normal after surgery.  Elevate your leg when you are not up walking on it.   o Continue to use the breathing machine you got in the hospital (incentive spirometer) which will help keep your temperature down.  It is common for your temperature to cycle up and down following surgery, especially at night when you are not up moving around and exerting yourself.  The breathing machine keeps your lungs expanded and your temperature down.   DIET:  As you were doing prior to hospitalization, we recommend a well-balanced diet.  DRESSING / WOUND CARE / SHOWERING  Dressing change as needed.  No showering.  Staples will be removed in 2 weeks at [facility] clinic.  ACTIVITY  o Increase activity slowly as tolerated, but follow the weight bearing instructions below.   o No driving for 6 weeks or until further direction given by your physician.  You cannot drive while taking narcotics.  o No lifting or carrying greater than 10 lbs. until further directed by your surgeon. o Avoid periods of inactivity such as sitting longer than an hour when not asleep. This helps prevent blood clots.  o You may return to work once you are authorized by your doctor.     WEIGHT BEARING  Weightbearing as tolerated on the left   EXERCISES Gait training and ambulation training.  CONSTIPATION  Constipation is defined medically as fewer than three stools per week and severe constipation as less than one stool per week.  Even if you have a regular bowel pattern at home, your normal regimen is likely to be  disrupted due to multiple reasons following surgery.  Combination of anesthesia, postoperative narcotics, change in appetite and fluid intake all can affect your bowels.   YOU MUST use at least one of the following options; they are listed in order of increasing strength to get the job done.  They are all available over the counter, and you may need to use some, POSSIBLY even all of these options:    Drink plenty of fluids (prune juice may be helpful) and high fiber foods Colace 100 mg by mouth twice a day  Senokot for constipation as directed and as needed Dulcolax (bisacodyl), take with full glass of water  Miralax (polyethylene glycol) once or twice a day as needed.  If you have tried all these things and are unable to have a bowel movement in the first 3-4 days after surgery call either your surgeon or your primary doctor.    If you experience loose stools or diarrhea, hold the medications until you stool forms back up.  If your symptoms do not get better within 1 week or if they get worse, check with your doctor.  If you experience "the worst abdominal pain ever" or develop nausea or vomiting, please contact the office immediately for further recommendations for treatment.   ITCHING:  If you experience itching with your medications, try taking only a single pain pill, or even half a pain pill at a time.  You can also  use Benadryl over the counter for itching or also to help with sleep.   TED HOSE STOCKINGS:  Use stockings on both legs until for at least 2 weeks or as directed by physician office. They may be removed at night for sleeping.  MEDICATIONS:  See your medication summary on the "After Visit Summary" that nursing will review with you.  You may have some home medications which will be placed on hold until you complete the course of blood thinner medication.  It is important for you to complete the blood thinner medication as prescribed.  PRECAUTIONS:  If you experience chest pain or  shortness of breath - call 911 immediately for transfer to the hospital emergency department.   If you develop a fever greater that 101 F, purulent drainage from wound, increased redness or drainage from wound, foul odor from the wound/dressing, or calf pain - CONTACT YOUR SURGEON.                                                   FOLLOW-UP APPOINTMENTS:  If you do not already have a post-op appointment, please call the office for an appointment to be seen by your surgeon.  Guidelines for how soon to be seen are listed in your "After Visit Summary", but are typically between 1-4 weeks after surgery.  OTHER INSTRUCTIONS:     MAKE SURE YOU:  . Understand these instructions.  . Get help right away if you are not doing well or get worse.    Thank you for letting us be a part of your medical care team.  It is a privilege we respect greatly.  We hope these instructions will help you stay on track for a fast and full recovery!

## 2020-04-20 DIAGNOSIS — S72002A Fracture of unspecified part of neck of left femur, initial encounter for closed fracture: Secondary | ICD-10-CM

## 2020-04-20 LAB — BASIC METABOLIC PANEL
Anion gap: 6 (ref 5–15)
BUN: 27 mg/dL — ABNORMAL HIGH (ref 8–23)
CO2: 25 mmol/L (ref 22–32)
Calcium: 8.1 mg/dL — ABNORMAL LOW (ref 8.9–10.3)
Chloride: 108 mmol/L (ref 98–111)
Creatinine, Ser: 0.91 mg/dL (ref 0.44–1.00)
GFR, Estimated: >60 mL/min (ref >60)
Glucose, Bld: 113 mg/dL — ABNORMAL HIGH (ref 70–99)
Potassium: 3.8 mmol/L (ref 3.5–5.1)
Sodium: 139 mmol/L (ref 135–145)

## 2020-04-20 LAB — CBC
HCT: 33.5 % — ABNORMAL LOW (ref 36.0–46.0)
Hemoglobin: 10.9 g/dL — ABNORMAL LOW (ref 12.0–15.0)
MCH: 30.7 pg (ref 26.0–34.0)
MCHC: 32.5 g/dL (ref 30.0–36.0)
MCV: 94.4 fL (ref 80.0–100.0)
Platelets: 191 10*3/uL (ref 150–400)
RBC: 3.55 MIL/uL — ABNORMAL LOW (ref 3.87–5.11)
RDW: 14 % (ref 11.5–15.5)
WBC: 6.5 10*3/uL (ref 4.0–10.5)
nRBC: 0 % (ref 0.0–0.2)

## 2020-04-20 MED ORDER — FLUTICASONE PROPIONATE 50 MCG/ACT NA SUSP: 2.0000 | Freq: Every day | NASAL | 0 refills | Status: DC

## 2020-04-20 MED ORDER — ENOXAPARIN SODIUM 40 MG/0.4ML ~~LOC~~ SOLN: 40.0000 mg | SUBCUTANEOUS | 0 refills | Status: DC

## 2020-04-20 MED ORDER — GABAPENTIN 100 MG PO CAPS: 100.0000 mg | ORAL_CAPSULE | Freq: Two times a day (BID) | ORAL | Status: DC

## 2020-04-20 NOTE — TOC Initial Note (Signed)
Transition of Care [facility]) - Initial/Assessment Note    Patient Details  Name: Cynthia Ritter MRN: [id number] Date of Birth: 03/18/1933  Transition of Care [facility]) CM/SW Contact:    Shelbie Ammons, RN Phone Number: 04/20/2020, 11:19 AM  Clinical Narrative:     RNCM met with patient in room. Patient is from home and is normally independent. Patient is status post surgical repair of left hip fracture. Patient reports that she lives in a split level home and to get to her bedroom is 10 steps. Patient reports that her family is changing her living room around and that they are renting a hospital bed as well. Patient is agreeable to home health being arranged and 3N1.  RNCM reached out to [place] [facility] [place] about home health.  RNCM reached out to [facility] with [employer] for 3N1.            Expected Discharge Plan: [place] Barriers to Discharge: No Barriers Identified   Patient Goals and CMS Choice        Expected Discharge Plan and Services Expected Discharge Plan: [place] Choice: [place] arrangements for the past 2 months: Single Family Home                 DME Arranged: 3-N-1 DME Agency: [place]: PT,OT [place] Agency: [place] (now [facility]) Date [place]: 04/20/20 Time HH Agency Contacted: [phone]    Prior Living Arrangements/Services Living arrangements for the past 2 months: [place] with:: [place] Patient language and need for interpreter reviewed:: Yes Do you feel safe going back to the place where you live?: Yes      Need for Family Participation in Patient Care: Yes (Comment) Care giver support system in place?: Yes (comment)   Criminal Activity/Legal Involvement Pertinent to Current Situation/Hospitalization: No - Comment as needed  Activities of Daily Living Home Assistive Devices/Equipment: None,Cane (specify quad or  straight) ADL Screening (condition at time of admission) Patient's cognitive ability adequate to safely complete daily activities?: Yes Is the patient deaf or have difficulty hearing?: Yes (patient uses hearing aids) Does the patient have difficulty seeing, even when wearing glasses/contacts?: No Does the patient have difficulty concentrating, remembering, or making decisions?: No Patient able to express need for assistance with ADLs?: No Does the patient have difficulty dressing or bathing?: No Independently performs ADLs?: Yes (appropriate for developmental age) Does the patient have difficulty walking or climbing stairs?: No Weakness of Legs: Left Weakness of Arms/Hands: None  Permission Sought/Granted                  Emotional Assessment Appearance:: Appears stated age Attitude/Demeanor/Rapport: Engaged Affect (typically observed): Appropriate Orientation: : Oriented to Self,Oriented to Place,Oriented to  Time,Oriented to Situation Alcohol / Substance Use: Not Applicable Psych Involvement: No (comment)  Admission diagnosis:  Hip fracture ([place]) [S72.009A] Fall, initial encounter [W19.XXXA] Closed left hip fracture, initial encounter [facility]) [S72.002A] Patient Active Problem List   Diagnosis Date Noted  . Femoral neck fracture ([place]) 04/16/2020  . Hypertension   . Depression   . Incontinence    PCP:  Sallee Lange, NP Pharmacy:   [facility] [place], [address] [facility] [address] [address] AT [place] [address] Phone: [phone] Fax: [phone]  Social Determinants of Health (SDOH) Interventions    Readmission Risk Interventions No flowsheet data found.

## 2020-04-20 NOTE — Progress Notes (Signed)
Physical Therapy Treatment Patient Details Name: Cynthia Ritter MRN: [id number] DOB: October 11, 1932 Today's Date: 04/20/2020    History of Present Illness Pt is an 86 yo female s/p percutaneous pinning of L femoral neck fracture after a fall. PMH of depression, HTN, incontinence.    PT Comments    Pt alert, in recliner, agreeable to PT. Exhibited mild pain signs/symptoms when transitioning positions such as standing up or laying down. Initally unable to maintain standing with first sit to stand transfer due to stiffness; second attempt improved safety and able to come up into fully standing with good balance, RW, and CGA. Pt with good recall of hand placement for safety from AM session.  The patient was able to ambulate ~138ft with RW and CGA (chair follow for safety) predominantly step through gait, no LOB noted, pt cued for upright posture occasionally. Improved gait velocity and cadence over time. Pt returned to supine at end of session with supervision (used UE to assist LLE back into bed) and performed several exercises, physical assist for L SLR. Repositioned with all needs in reach. The patient would benefit from further skilled PT intervention to continue to progress towards goals. Recommendation remains appropriate.     Follow Up Recommendations  Home health PT;Supervision for mobility/OOB     Equipment Recommendations  3in1 (PT);Rolling walker with 5" wheels    Recommendations for Other Services       Precautions / Restrictions Precautions Precautions: Fall Restrictions Weight Bearing Restrictions: Yes LLE Weight Bearing: Weight bearing as tolerated    Mobility  Bed Mobility   Bed Mobility: Sit to Supine       Sit to supine: HOB elevated;Supervision   General bed mobility comments: pt determined to perform modI, cued for RLE to assist LLE as needed, pt ultimately had to utilize UEs to return LLE to bed.  Transfers Overall transfer level: Needs assistance Equipment  used: Rolling walker (2 wheeled) Transfers: Sit to/from Stand Sit to Stand: Min guard Stand pivot transfers: Min guard       General transfer comment: initally unable to maintain standing with first transfer due to stiffness; second attempt improved safety and able to come up into fully standing with good balance  Ambulation/Gait Ambulation/Gait assistance: Min guard Gait Distance (Feet): 170 Feet Assistive device: Rolling walker (2 wheeled)   Gait velocity: decreased   General Gait Details: predominantly step through gait, no LOB noted, pt cued for upright posture occasionally. Improved gait velocity and cadence over time.   Stairs Stairs: Yes Stairs assistance: Min guard Stair Management: Step to pattern;Two rails;Sideways;Forwards   General stair comments: Pt verbalized and demonstrated understanding of stair navigation both forwards and sideways. CGA throughout for safety   Wheelchair Mobility    Modified Rankin (Stroke Patients Only)       Balance Overall balance assessment: Needs assistance Sitting-balance support: Feet supported Sitting balance-Leahy Scale: Good     Standing balance support: Bilateral upper extremity supported;During functional activity Standing balance-Leahy Scale: Fair Standing balance comment: able to stand statically without UE support                            Cognition Arousal/Alertness: Awake/alert Behavior During Therapy: WFL for tasks assessed/performed Overall Cognitive Status: Within Functional Limits for tasks assessed  General Comments: Pt is A and O and cooperative      Exercises General Exercises - Lower Extremity Short Arc Quad: AROM;Strengthening;Left (12) Heel Slides: AROM;Strengthening;Left (12) Hip ABduction/ADduction: AROM;Strengthening;Left (12) Straight Leg Raises: AAROM;Left (12)    General Comments        Pertinent Vitals/Pain Pain Assessment:  Faces Pain Score: 2  Faces Pain Scale: Hurts a little bit Pain Location: L hip upon standing Pain Descriptors / Indicators: Aching;Grimacing Pain Intervention(s): Limited activity within patient's tolerance;Monitored during session;Repositioned    Home Living                      Prior Function            PT Goals (current goals can now be found in the care plan section) Progress towards PT goals: Progressing toward goals    Frequency    BID      PT Plan Current plan remains appropriate    Co-evaluation              AM-PAC PT "6 Clicks" Mobility   Outcome Measure  Help needed turning from your back to your side while in a flat bed without using bedrails?: A Little Help needed moving from lying on your back to sitting on the side of a flat bed without using bedrails?: A Little Help needed moving to and from a bed to a chair (including a wheelchair)?: A Little Help needed standing up from a chair using your arms (e.g., wheelchair or bedside chair)?: A Little Help needed to walk in hospital room?: A Little Help needed climbing 3-5 steps with a railing? : A Little 6 Click Score: 18    End of Session Equipment Utilized During Treatment: Gait belt Activity Tolerance: Patient tolerated treatment well Patient left: in bed;with call bell/phone within reach;with bed alarm set Nurse Communication: Mobility status PT Visit Diagnosis: Other abnormalities of gait and mobility (R26.89);Muscle weakness (generalized) (M62.81);Difficulty in walking, not elsewhere classified (R26.2);Pain Pain - Right/Left: Left Pain - part of body: Hip     Time: 6759-1638 PT Time Calculation (min) (ACUTE ONLY): 33 min  Charges:  $Gait Training: 8-22 mins $Therapeutic Exercise: 8-22 mins                     Lieutenant Diego PT, DPT 2:45 PM,04/20/20

## 2020-04-20 NOTE — Progress Notes (Signed)
Physical Therapy Treatment Patient Details Name: Cynthia Ritter MRN: [id number] DOB: March 19, 1933 Today's Date: 04/20/2020    History of Present Illness Pt is an 86 yo female s/p percutaneous pinning of L femoral neck fracture after a fall. PMH of depression, HTN, incontinence.    PT Comments    Pt alert, agreeable to PT, eager to transfer to [facility]. With stand pivot with UE, CGA pt able to sit on BSC, urinate and perform pericare with supervision. Sit <> stand with RW and CGA for the rest of the session to ensure safety. Pt did need verbal cueing for hand placement during transfers to maximize safety. The patient was able to ambulate ~42ft total with RW and CGA. Predominantly utilized step to gait pattern but occasionally step through, no unsteadiness or LOB noted. The patient was able to ascend/descend 4 steps with bilateral rails and CGA. Verbalized understanding and demonstrated safe navigation. Returned to room in recliner with all needs in reach. The patient would benefit from further skilled PT intervention to continue to progress towards goals.  Recommendation remains appropriate.      Follow Up Recommendations  Home health PT;Supervision for mobility/OOB     Equipment Recommendations  3in1 (PT)    Recommendations for Other Services       Precautions / Restrictions Precautions Precautions: Fall Restrictions Weight Bearing Restrictions: Yes LLE Weight Bearing: Weight bearing as tolerated    Mobility  Bed Mobility               General bed mobility comments: Pt sitting EOB at start of session  Transfers Overall transfer level: Needs assistance Equipment used: Rolling walker (2 wheeled) Transfers: Sit to/from [facility] Sit to Stand: Min guard Stand pivot transfers: Min guard       General transfer comment: pt able to stand pivot to [facility] without RW; sit<>stand from [facility] and remainder of session transfers with CGA and  RW  Ambulation/Gait Ambulation/Gait assistance: Min guard Gait Distance (Feet): 65 Feet Assistive device: Rolling walker (2 wheeled)   Gait velocity: decreased   General Gait Details: no LOB noted this session, predominantly step to gait pattern utilized, occasionally able to use step through   Stairs Stairs: Yes Stairs assistance: Min guard Stair Management: Step to pattern;Two rails;Sideways;Forwards   General stair comments: Pt verbalized and demonstrated understanding of stair navigation both forwards and sideways. CGA throughout for safety   Wheelchair Mobility    Modified Rankin (Stroke Patients Only)       Balance Overall balance assessment: Needs assistance Sitting-balance support: Feet supported Sitting balance-Leahy Scale: Good     Standing balance support: Bilateral upper extremity supported;During functional activity Standing balance-Leahy Scale: Fair Standing balance comment: able to stand statically without UE support                            Cognition Arousal/Alertness: Awake/alert Behavior During Therapy: WFL for tasks assessed/performed Overall Cognitive Status: Within Functional Limits for tasks assessed                                 General Comments: Pt is A and O and cooperative      Exercises      General Comments        Pertinent Vitals/Pain Pain Assessment: 0-10 Pain Score: 2  Pain Location: L hip Pain Descriptors / Indicators: Aching;Grimacing Pain Intervention(s): Limited activity within  patient's tolerance;Monitored during session;Repositioned    Home Living                      Prior Function            PT Goals (current goals can now be found in the care plan section) Progress towards PT goals: Progressing toward goals    Frequency    BID      PT Plan Current plan remains appropriate    Co-evaluation              AM-PAC PT "6 Clicks" Mobility   Outcome Measure   Help needed turning from your back to your side while in a flat bed without using bedrails?: A Little Help needed moving from lying on your back to sitting on the side of a flat bed without using bedrails?: A Little Help needed moving to and from a bed to a chair (including a wheelchair)?: A Little Help needed standing up from a chair using your arms (e.g., wheelchair or bedside chair)?: A Little Help needed to walk in hospital room?: A Little Help needed climbing 3-5 steps with a railing? : A Little 6 Click Score: 18    End of Session Equipment Utilized During Treatment: Gait belt Activity Tolerance: Patient tolerated treatment well Patient left: in chair;with chair alarm set;with call bell/phone within reach Nurse Communication: Mobility status PT Visit Diagnosis: Other abnormalities of gait and mobility (R26.89);Muscle weakness (generalized) (M62.81);Difficulty in walking, not elsewhere classified (R26.2);Pain Pain - Right/Left: Left Pain - part of body: Hip     Time: 2633-3545 PT Time Calculation (min) (ACUTE ONLY): 38 min  Charges:  $Gait Training: 8-22 mins $Therapeutic Exercise: 23-37 mins                     Lieutenant Diego PT, DPT 11:09 AM,04/20/20

## 2020-04-20 NOTE — Care Management Important Message (Signed)
Important Message  Patient Details  Name: Cynthia Ritter MRN: [id number] Date of Birth: 03-Apr-1933   Medicare Important Message Given:  Yes     [name] [name] 04/20/2020, 11:35 AM

## 2020-04-20 NOTE — Progress Notes (Signed)
PROGRESS NOTE    SOPHONIE GOFORTH  QQI:[id number] DOB: [birth year] DOA: 04/16/2020 PCP: Sallee Lange, NP   Chief Complaint  Patient presents with  . Fall    Brief Narrative: 86 year old female w/ depression arthritis, hypertension, urinary incontinence seen at the med Center in [place] with a left hip pain following a mechanical fall and found to have a left femoral neck fracture and admitted to [facility] for operative intervention  Subjective:  Patient reports she was somewhat dizzy yesterday with ambulation.  Does not feel ready for home today.  Reports her family is preparing to move bed to her living room from upstairs  Assessment & Plan:  Left  Femoral neck fracture from fall-status post left hip pinning.  Postop doing well.  Continue PT OT, has advised home health PT.  Patient does not feel ready for home today was dizzy on ambulation yesterday.  Reports family is preparing to make her home in her living room.  Cont Lovenox for DVT prophylaxis pain control with opiates.   Hypertension: Controlled on losartan. Depression: Stable on on Prozac Incontinence: Continue home Myrbetriq and Detrol. Morbid obesity Body mass index is 34.72 kg/m: She will definitely benefit with weight loss and healthy lifestyle.  Nutrition: Diet Order            Diet regular Room service appropriate? Yes; Fluid consistency: Thin  Diet effective now                 DVT prophylaxis: enoxaparin (LOVENOX) injection 40 mg Start: 04/19/20 0815 SCDs Start: 04/19/20 0728S Code Status:   Code Status: Full Code  Family Communication: plan of care discussed with patient at bedside.  Status is: Inpatient Remains inpatient appropriate because:Ongoing active pain requiring inpatient pain management and Inpatient level of care appropriate due to severity of illness  Dispo: The patient is from: Home w/ son/husband              Anticipated d/c is to:  [facility]              Anticipated d/c date is:  Tomorrow              Patient currently is not medically stable to d/c.   Consultants:see note  Procedures:see note  Culture/Microbiology No results found for: SDES, SPECREQUEST, CULT, REPTSTATUS  Other culture-see note  Medications: Scheduled Meds: . acetaminophen  1,000 mg Oral Q8H  . docusate sodium  100 mg Oral BID  . enoxaparin (LOVENOX) injection  40 mg Subcutaneous Q24H  . feeding supplement  237 mL Oral BID BM  . fesoterodine  8 mg Oral Daily  . FLUoxetine  30 mg Oral Daily  . fluticasone  2 spray Each Nare Daily  . gabapentin  100 mg Oral BID  . losartan  25 mg Oral Daily  . mirabegron ER  25 mg Oral Daily  . multivitamin with minerals  1 tablet Oral Daily  . mupirocin ointment  1 application Nasal BID   Continuous Infusions: . sodium chloride Stopped (04/20/20 0017)  . methocarbamol (ROBAXIN) IV      Antimicrobials: Anti-infectives (From admission, onward)   Start     Dose/Rate Route Frequency Ordered Stop   04/19/20 0815  ceFAZolin (ANCEF) IVPB 2g/100 mL premix        2 g 200 mL/hr over 30 Minutes Intravenous Every 6 hours 04/19/20 0727 04/20/20 0214   04/17/20 1500  ceFAZolin (ANCEF) IVPB 2g/100 mL premix  2 g 200 mL/hr over 30 Minutes Intravenous Once 04/16/20 2144 04/18/20 1639     Objective: Vitals: Today's Vitals   04/19/20 2345 04/20/20 0432 04/20/20 0833 04/20/20 1135  BP: 119/62 114/68 137/69 121/63  Pulse: 71 63 62 72  Resp: 18 16 17 17   Temp:  97.8 F (36.6 C) 98.3 F (36.8 C) 98.3 F (36.8 C)  TempSrc:  Oral Oral   SpO2: 96% 94% 95% 100%  Weight:      Height:      PainSc:        Intake/Output Summary (Last 24 hours) at 04/20/2020 1151 Last data filed at 04/20/2020 0308 Gross per 24 hour  Intake 987.74 ml  Output --  Net 987.74 ml   Filed Weights   04/16/20 1639 04/18/20 1406  Weight: 88.9 kg 88 kg   Weight change:   Intake/Output from previous day: 12/16 0701 - 12/17 0700 In: 1467.7 [P.O.:600; I.V.:667.7; IV  Piggyback:200] Out: -  Intake/Output this shift: No intake/output data recorded.  Examination: General exam: AAOx3 , NAD, weak appearing. HEENT:Oral mucosa moist, Ear/Nose WNL grossly, dentition normal. Respiratory system: bilaterally clear,no wheezing or crackles,no use of accessory muscle Cardiovascular system: S1 & S2 +, No JVD,. Gastrointestinal system: Abdomen soft, NT,ND, BS+ Nervous System:Alert, awake, moving extremities and grossly nonfocal Extremities: No edema, distal peripheral pulses palpable.  Left femoral surgical site with dressing intact skin: No rashes,no icterus. MSK: Normal muscle bulk,tone, power  Data Reviewed: I have personally reviewed following labs and imaging studies CBC: Recent Labs  Lab 04/16/20 1648 04/18/20 0547 04/19/20 0435 04/20/20 0459  WBC 12.2* 7.5 7.5 6.5  NEUTROABS 9.7* 4.8  --   --   HGB 14.8 13.1 12.4 10.9*  HCT 45.5 41.2 37.4 33.5*  MCV 94.2 95.8 93.7 94.4  PLT 216 213 204 209   Basic Metabolic Panel: Recent Labs  Lab 04/16/20 1648 04/18/20 0547 04/19/20 0435 04/20/20 0459  NA 137 141 141 139  K 3.7 4.3 4.1 3.8  CL 104 106 108 108  CO2 22 26 25 25   GLUCOSE 88 110* 107* 113*  BUN 22 22 22  27*  CREATININE 0.94 0.99 0.92 0.91  CALCIUM 9.1 8.6* 8.0* 8.1*   GFR: Estimated Creatinine Clearance: 47.5 mL/min (by C-G formula based on SCr of 0.91 mg/dL). Liver Function Tests: No results for input(s): AST, ALT, ALKPHOS, BILITOT, PROT, ALBUMIN in the last 168 hours. No results for input(s): LIPASE, AMYLASE in the last 168 hours. No results for input(s): AMMONIA in the last 168 hours. Coagulation Profile: Recent Labs  Lab 04/16/20 1657  INR 1.0   Cardiac Enzymes: No results for input(s): CKTOTAL, CKMB, CKMBINDEX, TROPONINI in the last 168 hours. BNP (last 3 results) No results for input(s): PROBNP in the last 8760 hours. HbA1C: No results for input(s): HGBA1C in the last 72 hours. CBG: No results for input(s): GLUCAP in  the last 168 hours. Lipid Profile: No results for input(s): CHOL, HDL, LDLCALC, TRIG, CHOLHDL, LDLDIRECT in the last 72 hours. Thyroid Function Tests: No results for input(s): TSH, T4TOTAL, FREET4, T3FREE, THYROIDAB in the last 72 hours. Anemia Panel: No results for input(s): VITAMINB12, FOLATE, FERRITIN, TIBC, IRON, RETICCTPCT in the last 72 hours. Sepsis Labs: No results for input(s): PROCALCITON, LATICACIDVEN in the last 168 hours.  Recent Results (from the past 240 hour(s))  Resp Panel by RT-PCR (Flu A&B, Covid) Nasopharyngeal Swab     Status: None   Collection Time: 04/16/20  4:48 PM   Specimen: Nasopharyngeal Swab;  Nasopharyngeal(NP) swabs in vial transport medium  Result Value Ref Range Status   SARS Coronavirus 2 by RT PCR NEGATIVE NEGATIVE Final    Comment: (NOTE) SARS-CoV-2 target nucleic acids are NOT DETECTED.  The SARS-CoV-2 RNA is generally detectable in upper respiratory specimens during the acute phase of infection. The lowest concentration of SARS-CoV-2 viral copies this assay can detect is 138 copies/mL. A negative result does not preclude SARS-Cov-2 infection and should not be used as the sole basis for treatment or other patient management decisions. A negative result may occur with  improper specimen collection/handling, submission of specimen other than nasopharyngeal swab, presence of viral mutation(s) within the areas targeted by this assay, and inadequate number of viral copies(<138 copies/mL). A negative result must be combined with clinical observations, patient history, and epidemiological information. The expected result is Negative.  Fact Sheet for Patients:  EntrepreneurPulse.com.au  Fact Sheet for Healthcare Providers:  IncredibleEmployment.be  This test is no t yet approved or cleared by the [place] FDA and  has been authorized for detection and/or diagnosis of SARS-CoV-2 by FDA under an Emergency Use  Authorization (EUA). This EUA will remain  in effect (meaning this test can be used) for the duration of the COVID-19 declaration under Section 564(b)(1) of the Act, 21 U.S.C.section 360bbb-3(b)(1), unless the authorization is terminated  or revoked sooner.       Influenza A by PCR NEGATIVE NEGATIVE Final   Influenza B by PCR NEGATIVE NEGATIVE Final    Comment: (NOTE) The Xpert Xpress SARS-CoV-2/FLU/RSV plus assay is intended as an aid in the diagnosis of influenza from Nasopharyngeal swab specimens and should not be used as a sole basis for treatment. Nasal washings and aspirates are unacceptable for Xpert Xpress SARS-CoV-2/FLU/RSV testing.  Fact Sheet for Patients: EntrepreneurPulse.com.au  Fact Sheet for Healthcare Providers: IncredibleEmployment.be  This test is not yet approved or cleared by the [place] FDA and has been authorized for detection and/or diagnosis of SARS-CoV-2 by FDA under an Emergency Use Authorization (EUA). This EUA will remain in effect (meaning this test can be used) for the duration of the COVID-19 declaration under Section 564(b)(1) of the Act, 21 U.S.C. section 360bbb-3(b)(1), unless the authorization is terminated or revoked.  Performed at [facility], [address], [place], [place] [address]   Surgical PCR screen     Status: None   Collection Time: 04/17/20  4:33 AM   Specimen: Nasal Mucosa; Nasal Swab  Result Value Ref Range Status   MRSA, PCR NEGATIVE NEGATIVE Final   Staphylococcus aureus NEGATIVE NEGATIVE Final    Comment: (NOTE) The Xpert SA Assay (FDA approved for NASAL specimens in patients 21 years of age and older), is one component of a comprehensive surveillance program. It is not intended to diagnose infection nor to guide or monitor treatment. Performed at [facility], [address] [place], [place] [address]      Radiology Studies: DG HIP OPERATIVE UNILAT W  OR W/O PELVIS LEFT  Result Date: 04/18/2020 CLINICAL DATA:  Hip fracture EXAM: OPERATIVE left HIP (WITH PELVIS IF PERFORMED) 7 VIEWS TECHNIQUE: Fluoroscopic spot image(s) were submitted for interpretation post-operatively. COMPARISON:  04/16/2020 FINDINGS: Seven low resolution intraoperative spot views of the left hip. The images were obtained during internal fixation of left femoral neck fracture. The images demonstrate 3 threaded screws fixating the left femoral neck fracture. There is anatomic alignment. IMPRESSION: Intraoperative fluoroscopic assistance provided during internal fixation of left femoral neck fracture. Electronically Signed  By: Donavan Foil M.D.   On: 04/18/2020 19:06     LOS: 4 days   Antonieta Pert, MD [facility]  04/20/2020, 11:51 AM

## 2020-04-20 NOTE — Progress Notes (Signed)
OT Cancellation Note  Patient Details Name: Cynthia Ritter MRN: [id number] DOB: 03-13-1933   Cancelled Treatment:    Reason Eval/Treat Not Completed: Other (comment). Pt working with PT, not available for OT tx. Will re-attempt at later date/time as able.   Jeni Salles, MPH, MS, OTR/L ascom [phone] 04/20/20, 2:23 PM

## 2020-04-20 NOTE — Progress Notes (Signed)
  Subjective: 2 Days Post-Op Procedure(s) (LRB): CANNULATED HIP PINNING (Left) Patient reports pain as mild to moderate.   Patient is well, and has had no acute complaints or problems Plan is to go Rehab after hospital stay. Negative for chest pain and shortness of breath Fever: no Gastrointestinal: Negative for nausea and vomiting  Objective: Vital signs in last 24 hours: Temp:  [97.7 F (36.5 C)-98.8 F (37.1 C)] 97.8 F (36.6 C) (12/17 0432) Pulse Rate:  [63-84] 63 (12/17 0432) Resp:  [15-18] 16 (12/17 0432) BP: (110-131)/(62-74) 114/68 (12/17 0432) SpO2:  [93 %-96 %] 94 % (12/17 0432)  Intake/Output from previous day:  Intake/Output Summary (Last 24 hours) at 04/20/2020 0658 Last data filed at 04/20/2020 0308 Gross per 24 hour  Intake 1467.74 ml  Output --  Net 1467.74 ml    Intake/Output this shift: Total I/O In: 634.2 [P.O.:120; I.V.:414.2; IV Piggyback:100] Out: -   Labs: Recent Labs    04/18/20 0547 04/19/20 0435 04/20/20 0459  HGB 13.1 12.4 10.9*   Recent Labs    04/19/20 0435 04/20/20 0459  WBC 7.5 6.5  RBC 3.99 3.55*  HCT 37.4 33.5*  PLT 204 191   Recent Labs    04/19/20 0435 04/20/20 0459  NA 141 139  K 4.1 3.8  CL 108 108  CO2 25 25  BUN 22 27*  CREATININE 0.92 0.91  GLUCOSE 107* 113*  CALCIUM 8.0* 8.1*   No results for input(s): LABPT, INR in the last 72 hours.   EXAM General - Patient is Alert and Oriented Extremity - Neurovascular intact Sensation intact distally Dorsiflexion/Plantar flexion intact Compartment soft Dressing/Incision - clean, dry, no drainage Motor Function - intact, moving foot and toes well on exam.  The patient ambulated 75 feet with physical therapy  Past Medical History:  Diagnosis Date  . Arthritis    left heel/ knees and ankle, wears a knee brace occasionally  . Depression   . Hot flashes   . Hypertension   . Incontinence    URINARY  . Motion sickness    planes, back seat-cars  . Seasonal  allergies     Assessment/Plan: 2 Days Post-Op Procedure(s) (LRB): CANNULATED HIP PINNING (Left) Principal Problem:   Femoral neck fracture (HCC) Active Problems:   Hypertension   Depression   Incontinence  Estimated body mass index is 34.37 kg/m as calculated from the following:   Height as of this encounter: 5\' 3"  (1.6 m).   Weight as of this encounter: 88 kg. Advance diet Up with therapy D/C IV fluids  Follow-up at [facility] clinic orthopedics in 2 weeks for staple removal and x-rays of the left hip  DVT Prophylaxis - Lovenox, Foot Pumps and TED hose Weight-Bearing as tolerated to left leg  Reche Dixon, PA-C Orthopaedic Surgery 04/20/2020, 6:58 AM

## 2020-04-21 LAB — CBC
HCT: 33.4 % — ABNORMAL LOW (ref 36.0–46.0)
Hemoglobin: 10.7 g/dL — ABNORMAL LOW (ref 12.0–15.0)
MCH: 30.6 pg (ref 26.0–34.0)
MCHC: 32 g/dL (ref 30.0–36.0)
MCV: 95.4 fL (ref 80.0–100.0)
Platelets: 198 10*3/uL (ref 150–400)
RBC: 3.5 MIL/uL — ABNORMAL LOW (ref 3.87–5.11)
RDW: 13.9 % (ref 11.5–15.5)
WBC: 6.5 10*3/uL (ref 4.0–10.5)
nRBC: 0 % (ref 0.0–0.2)

## 2020-04-21 LAB — BASIC METABOLIC PANEL
Anion gap: 7 (ref 5–15)
BUN: 16 mg/dL (ref 8–23)
CO2: 25 mmol/L (ref 22–32)
Calcium: 8.4 mg/dL — ABNORMAL LOW (ref 8.9–10.3)
Chloride: 108 mmol/L (ref 98–111)
Creatinine, Ser: 0.78 mg/dL (ref 0.44–1.00)
GFR, Estimated: >60 mL/min (ref >60)
Glucose, Bld: 109 mg/dL — ABNORMAL HIGH (ref 70–99)
Potassium: 4 mmol/L (ref 3.5–5.1)
Sodium: 140 mmol/L (ref 135–145)

## 2020-04-21 NOTE — Progress Notes (Signed)
Physical Therapy Treatment Patient Details Name: Cynthia Ritter MRN: [id number] DOB: Sep 26, 1932 Today's Date: 04/21/2020    History of Present Illness Pt is an 86 yo female s/p percutaneous pinning of L femoral neck fracture after a fall. PMH of depression, HTN, incontinence.    PT Comments    Pt was sitting in recliner upon arriving. She states," I'm ready to go home." Pt requested not to ambulate so she would not be too tired at DC. Pt was able to perform HEP exercises. See exercises listed below. She reports husband already brought medical equipment home. Pt to DC home with HHPT to follow. She will continue to benefit from PT at DC to address deficit while improving safety with mobility/transfers/ and gait. Pt was in recliner at conclusion of session with RN aware pt has been cleared from PT standpoint for safe DC home.    Follow Up Recommendations  Home health PT;Supervision for mobility/OOB     Equipment Recommendations  None recommended by PT (pt has recieved all needed equipment)    Recommendations for Other Services       Precautions / Restrictions Precautions Precautions: Fall Restrictions Weight Bearing Restrictions: Yes LLE Weight Bearing: Weight bearing as tolerated    Mobility  Bed Mobility    General bed mobility comments: Pt was in recliner upon arriving.  Transfers    General transfer comment: " I'm not trying to be too tired before I go home."     Balance Overall balance assessment: Needs assistance Sitting-balance support: Feet supported Sitting balance-Leahy Scale: Good     Standing balance support: Bilateral upper extremity supported;During functional activity Standing balance-Leahy Scale: Good         Cognition Arousal/Alertness: Awake/alert Behavior During Therapy: WFL for tasks assessed/performed Overall Cognitive Status: Within Functional Limits for tasks assessed        General Comments: Pt is A and O and cooperative      Exercises  General Exercises - Lower Extremity Ankle Circles/Pumps: AROM;Both;10 reps Quad Sets: AROM;Both;10 reps Gluteal Sets: AROM;10 reps Heel Slides: AROM;Strengthening;Left Hip ABduction/ADduction: AROM;Strengthening;Left Straight Leg Raises: AAROM;Left        Pertinent Vitals/Pain Pain Assessment:  (" sore.") Pain Score: 0-No pain Faces Pain Scale: Hurts a little bit Pain Descriptors / Indicators: Aching;Sore Pain Intervention(s): Limited activity within patient's tolerance;Monitored during session;Premedicated before session;Repositioned           PT Goals (current goals can now be found in the care plan section) Acute Rehab PT Goals Patient Stated Goal: go home Progress towards PT goals: Progressing toward goals    Frequency    BID      PT Plan Current plan remains appropriate       AM-PAC PT "6 Clicks" Mobility   Outcome Measure  Help needed turning from your back to your side while in a flat bed without using bedrails?: A Little Help needed moving from lying on your back to sitting on the side of a flat bed without using bedrails?: A Little Help needed moving to and from a bed to a chair (including a wheelchair)?: A Little Help needed standing up from a chair using your arms (e.g., wheelchair or bedside chair)?: A Little Help needed to walk in hospital room?: A Little Help needed climbing 3-5 steps with a railing? : A Little 6 Click Score: 18    End of Session   Activity Tolerance: Patient tolerated treatment well Patient left: in chair;with call bell/phone within reach;with chair alarm set Nurse  Communication: Mobility status PT Visit Diagnosis: Other abnormalities of gait and mobility (R26.89);Muscle weakness (generalized) (M62.81);Difficulty in walking, not elsewhere classified (R26.2);Pain Pain - Right/Left: Left Pain - part of body: Hip     Time: 1100-1114 PT Time Calculation (min) (ACUTE ONLY): 14 min  Charges:  $Therapeutic Exercise: 8-22 mins                      Julaine Fusi PTA 04/21/20, 12:11 PM

## 2020-04-21 NOTE — Discharge Summary (Signed)
Physician Discharge Summary  Cynthia Ritter DUP:[id number] DOB: 1932/06/07 DOA: 04/16/2020  PCP: Sallee Lange, NP  Admit date: 04/16/2020 Discharge date: 04/21/2020  Admitted From: home Disposition:  [place]  Recommendations for Outpatient Follow-up:  1. Follow up with PCP in 1-2 weeks 2. Please obtain BMP/CBC in one week 3. Please follow up on the following pending results:  Home Health:YES HH PT OT,  Equipment/Devices: 3 in 1, RW 5" wheels  Discharge Condition: Stable Code Status:   Code Status: Full Code Diet recommendation:  Diet Order            Diet regular Room service appropriate? Yes; Fluid consistency: Thin  Diet effective now                 Brief/Interim Summary: 86 year old female w/ depression arthritis, hypertension, urinary incontinence seen at the med Center in [place] with a left hip pain following a mechanical fall and found to have a left femoral neck fracture and admitted to [facility] for operative intervention Patient underwent left hip pinning.  Postop doing well.  Seen by PT OT and has cleared for discharge home with home health PT OT and DME Pain is controlled.  She has been cleared for discharge by orthopedic team and she will keep on Lovenox x14 days for DVT prophylaxis. Discharge Diagnoses:   Left  Femoral neck fracture from fall-status post left hip pinning.  Postop doing well.  PT OT RECS FOR HHPT AND dme, medically stable for d/c home, f/u with [place] in 2 wks.Cont Lovenox for DVT prophylaxis pain control with opiates as prescribed by orthopedics.   Hypertension: Continue home losartan. Depression: Continue her Prozac Incontinence: Continue home Myrbetriq and Detrol. Morbid obesity Body mass index is 34.72 kg/m: Benefit with weight loss strategy from PCP.    Consults:  ortho  Subjective: Resting on bedside eating meal, pain controlled  Discharge Exam: Vitals:   04/21/20 0422 04/21/20 0840  BP: 125/70 136/69  Pulse: 72 67   Resp: 17 18  Temp: 97.7 F (36.5 C) 98.7 F (37.1 C)  SpO2: 96% 100%   General: Pt is alert, awake, not in acute distress Cardiovascular: RRR, S1/S2 +, no rubs, no gallops Respiratory: CTA bilaterally, no wheezing, no rhonchi Abdominal: Soft, NT, ND, bowel sounds + Extremities: no edema, no cyanosis Hip surgical site with a small dressing intact. Discharge Instructions  Discharge Instructions    Discharge instructions   Complete by: As directed    Follow-up with orthopedics in 2 weeks for stitch removal  Please call call MD or return to ER for similar or worsening recurring problem that brought you to hospital or if any fever,nausea/vomiting,abdominal pain, uncontrolled pain, chest pain,  shortness of breath or any other alarming symptoms.  Please follow-up your doctor as instructed in a week time and call the office for appointment.  Please avoid alcohol, smoking, or any other illicit substance and maintain healthy habits including taking your regular medications as prescribed.  You were cared for by a hospitalist during your hospital stay. If you have any questions about your discharge medications or the care you received while you were in the hospital after you are discharged, you can call the unit and ask to speak with the hospitalist on call if the hospitalist that took care of you is not available.  Once you are discharged, your primary care physician will handle any further medical issues. Please note that NO REFILLS for any discharge medications will be authorized once  you are discharged, as it is imperative that you return to your primary care physician (or establish a relationship with a primary care physician if you do not have one) for your aftercare needs so that they can reassess your need for medications and monitor your lab values   Increase activity slowly   Complete by: As directed    Leave dressing on - Keep it clean, dry, and intact until clinic visit   Complete  by: As directed      Allergies as of 04/21/2020   No Known Allergies     Medication List    TAKE these medications   cyanocobalamin 1000 MCG tablet Take 1,000 mcg by mouth every other day.   enoxaparin 40 MG/0.4ML injection Commonly known as: LOVENOX Inject 0.4 mLs (40 mg total) into the skin daily for 14 days.   FLUoxetine 20 MG capsule Commonly known as: PROZAC Take 20 mg by mouth daily. Take with 10mg  capsule to equal 30mg  total   FLUoxetine 10 MG capsule Commonly known as: PROZAC Take 10 mg by mouth daily. Take with 20mg  capsule to equal 30mg  total   fluticasone 50 MCG/ACT nasal spray Commonly known as: FLONASE Place 2 sprays into both nostrils daily.   gabapentin 100 MG capsule Commonly known as: NEURONTIN Take 1 capsule (100 mg total) by mouth 2 (two) times daily. What changed: See the new instructions.   HYDROcodone-acetaminophen 5-325 MG tablet Commonly known as: NORCO/VICODIN Take 1-2 tablets by mouth every 6 (six) hours as needed for moderate pain.   losartan 25 MG tablet Commonly known as: COZAAR Take 25 mg by mouth daily.   Myrbetriq 25 MG Tb24 tablet Generic drug: mirabegron ER Take 25 mg by mouth daily.   naproxen sodium 220 MG tablet Commonly known as: ALEVE Take 220-440 mg by mouth 2 (two) times daily as needed (pain).   tolterodine 4 MG 24 hr capsule Commonly known as: DETROL LA Take 4 mg by mouth daily.            Durable Medical Equipment  (From admission, onward)         Start     Ordered   04/20/20 1455  For home use only DME Walker rolling  Once       Question Answer Comment  Walker: With [place] Wheels   Patient needs a walker to treat with the following condition Weakness      04/20/20 1455   04/20/20 1258  For home use only DME 3 n 1  Once        04/20/20 1257           Discharge Care Instructions  (From admission, onward)         Start     Ordered   04/21/20 0000  Leave dressing on - Keep it clean, dry, and  intact until clinic visit        04/21/20 0921          Follow-up Information    Reche Dixon, PA-C. Schedule an appointment as soon as possible for a visit in 2 week(s).   Specialty: Orthopedic Surgery Why: For staple removal and x-rays of the left hip Contact information: [address] [place] [address] [phone]              No Known Allergies  The results of significant diagnostics from this hospitalization (including imaging, microbiology, ancillary and laboratory) are listed below for reference.    Microbiology:  Recent Results (from the past 240 hour(s))  Resp Panel by RT-PCR (Flu A&B, Covid) Nasopharyngeal Swab     Status: None   Collection Time: 04/16/20  4:48 PM   Specimen: Nasopharyngeal Swab; Nasopharyngeal(NP) swabs in vial transport medium  Result Value Ref Range Status   SARS Coronavirus 2 by RT PCR NEGATIVE NEGATIVE Final    Comment: (NOTE) SARS-CoV-2 target nucleic acids are NOT DETECTED.  The SARS-CoV-2 RNA is generally detectable in upper respiratory specimens during the acute phase of infection. The lowest concentration of SARS-CoV-2 viral copies this assay can detect is 138 copies/mL. A negative result does not preclude SARS-Cov-2 infection and should not be used as the sole basis for treatment or other patient management decisions. A negative result may occur with  improper specimen collection/handling, submission of specimen other than nasopharyngeal swab, presence of viral mutation(s) within the areas targeted by this assay, and inadequate number of viral copies(<138 copies/mL). A negative result must be combined with clinical observations, patient history, and epidemiological information. The expected result is Negative.  Fact Sheet for Patients:  EntrepreneurPulse.com.au  Fact Sheet for Healthcare Providers:  IncredibleEmployment.be  This test is no t yet approved or  cleared by the [place] FDA and  has been authorized for detection and/or diagnosis of SARS-CoV-2 by FDA under an Emergency Use Authorization (EUA). This EUA will remain  in effect (meaning this test can be used) for the duration of the COVID-19 declaration under Section 564(b)(1) of the Act, 21 U.S.C.section 360bbb-3(b)(1), unless the authorization is terminated  or revoked sooner.       Influenza A by PCR NEGATIVE NEGATIVE Final   Influenza B by PCR NEGATIVE NEGATIVE Final    Comment: (NOTE) The Xpert Xpress SARS-CoV-2/FLU/RSV plus assay is intended as an aid in the diagnosis of influenza from Nasopharyngeal swab specimens and should not be used as a sole basis for treatment. Nasal washings and aspirates are unacceptable for Xpert Xpress SARS-CoV-2/FLU/RSV testing.  Fact Sheet for Patients: EntrepreneurPulse.com.au  Fact Sheet for Healthcare Providers: IncredibleEmployment.be  This test is not yet approved or cleared by the [place] FDA and has been authorized for detection and/or diagnosis of SARS-CoV-2 by FDA under an Emergency Use Authorization (EUA). This EUA will remain in effect (meaning this test can be used) for the duration of the COVID-19 declaration under Section 564(b)(1) of the Act, 21 U.S.C. section 360bbb-3(b)(1), unless the authorization is terminated or revoked.  Performed at [facility], [address], [place], [place] [address]   Surgical PCR screen     Status: None   Collection Time: 04/17/20  4:33 AM   Specimen: Nasal Mucosa; Nasal Swab  Result Value Ref Range Status   MRSA, PCR NEGATIVE NEGATIVE Final   Staphylococcus aureus NEGATIVE NEGATIVE Final    Comment: (NOTE) The Xpert SA Assay (FDA approved for NASAL specimens in patients 33 years of age and older), is one component of a comprehensive surveillance program. It is not intended to diagnose infection nor to guide or monitor  treatment. Performed at [facility], [address] [place], [place] [address]     Procedures/Studies: [place] Wrist Complete Left  Result Date: 04/16/2020 CLINICAL DATA:  Status post fall EXAM: LEFT WRIST - COMPLETE 3+ VIEW COMPARISON:  None. FINDINGS: No fracture or malalignment. Mild degenerative change at the STT interval and first [facility] joint. IMPRESSION: No acute osseous abnormality. Electronically Signed   By: Donavan Foil M.D.   On: 04/16/2020 15:52   DG  Chest Portable 1 View  Result Date: 04/16/2020 CLINICAL DATA:  Preoperative EXAM: PORTABLE CHEST 1 VIEW COMPARISON:  05/28/2013 FINDINGS: The heart size and mediastinal contours are within normal limits. Minimal diffuse interstitial pulmonary opacity. The visualized skeletal structures are unremarkable. IMPRESSION: Minimal diffuse interstitial pulmonary opacity, most likely minimal edema. No focal airspace opacity. Electronically Signed   By: Eddie Candle M.D.   On: 04/16/2020 17:46   DG HIP OPERATIVE UNILAT W OR W/O PELVIS LEFT  Result Date: 04/18/2020 CLINICAL DATA:  Hip fracture EXAM: OPERATIVE left HIP (WITH PELVIS IF PERFORMED) 7 VIEWS TECHNIQUE: Fluoroscopic spot image(s) were submitted for interpretation post-operatively. COMPARISON:  04/16/2020 FINDINGS: Seven low resolution intraoperative spot views of the left hip. The images were obtained during internal fixation of left femoral neck fracture. The images demonstrate 3 threaded screws fixating the left femoral neck fracture. There is anatomic alignment. IMPRESSION: Intraoperative fluoroscopic assistance provided during internal fixation of left femoral neck fracture. Electronically Signed   By: Donavan Foil M.D.   On: 04/18/2020 19:06   DG Hip Unilat With Pelvis 2-3 Views Left  Result Date: 04/16/2020 CLINICAL DATA:  Fall EXAM: DG HIP (WITH OR WITHOUT PELVIS) 2-3V LEFT COMPARISON:  None. FINDINGS: Osteopenia. There is an angulated and impacted subcapital fracture of  the left femoral neck. No other evidence of pelvic fracture or fracture of the proximal right femur seen in frontal view only. IMPRESSION: There is an angulated and impacted subcapital fracture of the left femoral neck. Electronically Signed   By: Eddie Candle M.D.   On: 04/16/2020 15:55    Labs: BNP (last 3 results) No results for input(s): BNP in the last 8760 hours. Basic Metabolic Panel: Recent Labs  Lab 04/16/20 1648 04/18/20 0547 04/19/20 0435 04/20/20 0459 04/21/20 0511  NA 137 141 141 139 140  K 3.7 4.3 4.1 3.8 4.0  CL 104 106 108 108 108  CO2 22 26 25 25 25   GLUCOSE 88 110* 107* 113* 109*  BUN 22 22 22  27* 16  CREATININE 0.94 0.99 0.92 0.91 0.78  CALCIUM 9.1 8.6* 8.0* 8.1* 8.4*   Liver Function Tests: No results for input(s): AST, ALT, ALKPHOS, BILITOT, PROT, ALBUMIN in the last 168 hours. No results for input(s): LIPASE, AMYLASE in the last 168 hours. No results for input(s): AMMONIA in the last 168 hours. CBC: Recent Labs  Lab 04/16/20 1648 04/18/20 0547 04/19/20 0435 04/20/20 0459 04/21/20 0511  WBC 12.2* 7.5 7.5 6.5 6.5  NEUTROABS 9.7* 4.8  --   --   --   HGB 14.8 13.1 12.4 10.9* 10.7*  HCT 45.5 41.2 37.4 33.5* 33.4*  MCV 94.2 95.8 93.7 94.4 95.4  PLT 216 213 204 191 198   Cardiac Enzymes: No results for input(s): CKTOTAL, CKMB, CKMBINDEX, TROPONINI in the last 168 hours. BNP: Invalid input(s): POCBNP CBG: No results for input(s): GLUCAP in the last 168 hours. D-Dimer No results for input(s): DDIMER in the last 72 hours. Hgb A1c No results for input(s): HGBA1C in the last 72 hours. Lipid Profile No results for input(s): CHOL, HDL, LDLCALC, TRIG, CHOLHDL, LDLDIRECT in the last 72 hours. Thyroid function studies No results for input(s): TSH, T4TOTAL, T3FREE, THYROIDAB in the last 72 hours.  Invalid input(s): FREET3 Anemia work up No results for input(s): VITAMINB12, FOLATE, FERRITIN, TIBC, IRON, RETICCTPCT in the last 72 hours. Urinalysis No  results found for: COLORURINE, APPEARANCEUR, LABSPEC, [place], GLUCOSEU, HGBUR, BILIRUBINUR, KETONESUR, PROTEINUR, UROBILINOGEN, NITRITE, LEUKOCYTESUR Sepsis Labs Invalid input(s): PROCALCITONIN,  WBC,  [place] Microbiology Recent Results (from the past 240 hour(s))  Resp Panel by RT-PCR (Flu A&B, Covid) Nasopharyngeal Swab     Status: None   Collection Time: 04/16/20  4:48 PM   Specimen: Nasopharyngeal Swab; Nasopharyngeal(NP) swabs in vial transport medium  Result Value Ref Range Status   SARS Coronavirus 2 by RT PCR NEGATIVE NEGATIVE Final    Comment: (NOTE) SARS-CoV-2 target nucleic acids are NOT DETECTED.  The SARS-CoV-2 RNA is generally detectable in upper respiratory specimens during the acute phase of infection. The lowest concentration of SARS-CoV-2 viral copies this assay can detect is 138 copies/mL. A negative result does not preclude SARS-Cov-2 infection and should not be used as the sole basis for treatment or other patient management decisions. A negative result may occur with  improper specimen collection/handling, submission of specimen other than nasopharyngeal swab, presence of viral mutation(s) within the areas targeted by this assay, and inadequate number of viral copies(<138 copies/mL). A negative result must be combined with clinical observations, patient history, and epidemiological information. The expected result is Negative.  Fact Sheet for Patients:  EntrepreneurPulse.com.au  Fact Sheet for Healthcare Providers:  IncredibleEmployment.be  This test is no t yet approved or cleared by the [place] FDA and  has been authorized for detection and/or diagnosis of SARS-CoV-2 by FDA under an Emergency Use Authorization (EUA). This EUA will remain  in effect (meaning this test can be used) for the duration of the COVID-19 declaration under Section 564(b)(1) of the Act, 21 U.S.C.section 360bbb-3(b)(1), unless the  authorization is terminated  or revoked sooner.       Influenza A by PCR NEGATIVE NEGATIVE Final   Influenza B by PCR NEGATIVE NEGATIVE Final    Comment: (NOTE) The Xpert Xpress SARS-CoV-2/FLU/RSV plus assay is intended as an aid in the diagnosis of influenza from Nasopharyngeal swab specimens and should not be used as a sole basis for treatment. Nasal washings and aspirates are unacceptable for Xpert Xpress SARS-CoV-2/FLU/RSV testing.  Fact Sheet for Patients: EntrepreneurPulse.com.au  Fact Sheet for Healthcare Providers: IncredibleEmployment.be  This test is not yet approved or cleared by the [place] FDA and has been authorized for detection and/or diagnosis of SARS-CoV-2 by FDA under an Emergency Use Authorization (EUA). This EUA will remain in effect (meaning this test can be used) for the duration of the COVID-19 declaration under Section 564(b)(1) of the Act, 21 U.S.C. section 360bbb-3(b)(1), unless the authorization is terminated or revoked.  Performed at [facility], [address], [place], [place] [address]   Surgical PCR screen     Status: None   Collection Time: 04/17/20  4:33 AM   Specimen: Nasal Mucosa; Nasal Swab  Result Value Ref Range Status   MRSA, PCR NEGATIVE NEGATIVE Final   Staphylococcus aureus NEGATIVE NEGATIVE Final    Comment: (NOTE) The Xpert SA Assay (FDA approved for NASAL specimens in patients 5 years of age and older), is one component of a comprehensive surveillance program. It is not intended to diagnose infection nor to guide or monitor treatment. Performed at [facility], [address] [place], [place] [address]   Time coordinating discharge: 25 minutes  SIGNED: Antonieta Pert, MD  [facility] 04/21/2020, 9:22 AM  If 7PM-7AM, please contact night-coverage [email or web address]

## 2020-04-21 NOTE — Progress Notes (Signed)
Patient Discharged home with husband. Nurse went over discharge instructions and pt showed understanding. No IV at time of discharge. Pt took all belongings. Wheeled down to medical mall by nurse and helped into husbands car.

## 2020-04-21 NOTE — TOC Transition Note (Signed)
Transition of Care [facility]) - CM/SW Discharge Note   Patient Details  Name: MCKAYLEE DIMALANTA MRN: [id number] Date of Birth: 07/13/1932  [facility] CM/SW Contact:  Izola Price, RN Phone Number: 04/21/2020, 2:04 PM   Clinical Narrative:    [facility]. [facility]charge Home with [facility] PT. DME in room per PT. Discharged with husband transporting patient home. Floydene Flock aware of discharge today. Simmie Davies RN CM   Final next level of care: [place] Barriers to Discharge: Barriers Resolved   Patient Goals and CMS Choice Patient states their goals for this hospitalization and ongoing recovery are:: Go home.      Discharge Placement                       Discharge Plan and Services     Post Acute Care Choice: Home Health          DME Arranged: 3-N-1 DME Agency: [facility]       HH Arranged: PT,OT [place] Agency: [facility]) ([facility] to [facility]. Floydene Flock contacted CM. He has spoken to patient yesterday.) Date [place]: 04/21/20 Time [place]: 1200 Representative spoke with at [place]: [place] (SD[facility]) Interventions     Readmission Risk Interventions No flowsheet data found.

## 2020-04-21 NOTE — Progress Notes (Signed)
  Subjective: 3 Days Post-Op Procedure(s) (LRB): CANNULATED HIP PINNING (Left) Patient reports pain as mild to moderate.   Patient is well, and has had no acute complaints or problems Plan is to go home with home health physical therapy after hospital stay. Negative for chest pain and shortness of breath Fever: no Gastrointestinal: Negative for nausea and vomiting  Objective: Vital signs in last 24 hours: Temp:  [97.7 F (36.5 C)-98.8 F (37.1 C)] 97.7 F (36.5 C) (12/18 0422) Pulse Rate:  [44-75] 72 (12/18 0422) Resp:  [17-19] 17 (12/18 0422) BP: (116-137)/(63-75) 125/70 (12/18 0422) SpO2:  [78 %-100 %] 96 % (12/18 0422)  Intake/Output from previous day:  Intake/Output Summary (Last 24 hours) at 04/21/2020 0707 Last data filed at 04/20/2020 1855 Gross per 24 hour  Intake 360 ml  Output --  Net 360 ml    Intake/Output this shift: No intake/output data recorded.  Labs: Recent Labs    04/19/20 0435 04/20/20 0459 04/21/20 0511  HGB 12.4 10.9* 10.7*   Recent Labs    04/20/20 0459 04/21/20 0511  WBC 6.5 6.5  RBC 3.55* 3.50*  HCT 33.5* 33.4*  PLT 191 198   Recent Labs    04/20/20 0459 04/21/20 0511  NA 139 140  K 3.8 4.0  CL 108 108  CO2 25 25  BUN 27* 16  CREATININE 0.91 0.78  GLUCOSE 113* 109*  CALCIUM 8.1* 8.4*   No results for input(s): LABPT, INR in the last 72 hours.   EXAM General - Patient is Alert and Oriented Extremity - Neurovascular intact Sensation intact distally Dorsiflexion/Plantar flexion intact Compartment soft Dressing/Incision - clean, dry, no drainage Motor Function - intact, moving foot and toes well on exam.  The patient ambulated 170 feet with physical therapy  Past Medical History:  Diagnosis Date  . Arthritis    left heel/ knees and ankle, wears a knee brace occasionally  . Depression   . Hot flashes   . Hypertension   . Incontinence    URINARY  . Motion sickness    planes, back seat-cars  . Seasonal allergies      Assessment/Plan: 3 Days Post-Op Procedure(s) (LRB): CANNULATED HIP PINNING (Left) Principal Problem:   Femoral neck fracture (HCC) Active Problems:   Hypertension   Depression   Incontinence  Estimated body mass index is 34.37 kg/m as calculated from the following:   Height as of this encounter: 5\' 3"  (1.6 m).   Weight as of this encounter: 88 kg. Advance diet Up with therapy D/C IV fluids  Follow-up at [facility] clinic orthopedics in 2 weeks for staple removal and x-rays of the left hip  DVT Prophylaxis - Lovenox, Foot Pumps and TED hose Weight-Bearing as tolerated to left leg  Reche Dixon, PA-C Orthopaedic Surgery 04/21/2020, 7:07 AM

## 2020-04-21 NOTE — Plan of Care (Signed)
  Problem: Education: Goal: Knowledge of General Education information will improve Description: Including pain rating scale, medication(s)/side effects and non-pharmacologic comfort measures Outcome: Adequate for Discharge   Problem: Health Behavior/Discharge Planning: Goal: Ability to manage health-related needs will improve Outcome: Adequate for Discharge   Problem: Clinical Measurements: Goal: Ability to maintain clinical measurements within normal limits will improve Outcome: Adequate for Discharge Goal: Will remain free from infection Outcome: Adequate for Discharge Goal: Diagnostic test results will improve Outcome: Adequate for Discharge Goal: Respiratory complications will improve Outcome: Adequate for Discharge Goal: Cardiovascular complication will be avoided Outcome: Adequate for Discharge   Problem: Activity: Goal: Risk for activity intolerance will decrease Outcome: Adequate for Discharge   Problem: Nutrition: Goal: Adequate nutrition will be maintained Outcome: Adequate for Discharge   Problem: Coping: Goal: Level of anxiety will decrease Outcome: Adequate for Discharge   Problem: Elimination: Goal: Will not experience complications related to bowel motility Outcome: Adequate for Discharge. Goal: Will not experience complications related to urinary retention Outcome: Adequate for Discharge   Problem: Pain Managment: Goal: General experience of comfort will improve Outcome: Adequate for Discharge   Problem: Safety: Goal: Ability to remain free from injury will improve Outcome: Adequate for Discharge   Problem: Skin Integrity: Goal: Risk for impaired skin integrity will decrease Outcome: Adequate for Discharge   Problem: Education: Goal: Verbalization of understanding the information provided (i.e., activity precautions, restrictions, etc) will improve Outcome: Adequate for Discharge Goal: Individualized Educational Video(s) Outcome: Adequate for  Discharge   Problem: Activity: Goal: Ability to ambulate and perform ADLs will improve Outcome: Adequate for Discharge   Problem: Clinical Measurements: Goal: Postoperative complications will be avoided or minimized Outcome: Adequate for Discharge   Problem: Self-Concept: Goal: Ability to maintain and perform role responsibilities to the fullest extent possible will improve Outcome: Adequate for Discharge   Problem: Pain Management: Goal: Pain level will decrease Outcome: Adequate for Discharge   Problem: Increased Nutrient Needs (NI-5.1) Goal: Food and/or nutrient delivery Description: Individualized approach for food/nutrient provision. Outcome: Adequate for Discharge   Problem: Acute Rehab OT Goals (only OT should resolve) Goal: Pt. Will Perform Lower Body Dressing Outcome: Adequate for Discharge Goal: Pt. Will Transfer To Toilet Outcome: Adequate for Discharge Goal: Pt. Will Perform Tub/Shower Transfer Outcome: Adequate for Discharge   Problem: Acute Rehab PT Goals(only PT should resolve) Goal: Pt Will Go Supine/Side To Sit Outcome: Adequate for Discharge Goal: Patient Will Transfer Sit To/From Stand Outcome: Adequate for Discharge Goal: Pt Will Ambulate Outcome: Adequate for Discharge Goal: Pt Will Go Up/Down Stairs Outcome: Adequate for Discharge

## 2020-06-05 ENCOUNTER — Other Ambulatory Visit: Payer: Self-pay | Admitting: Surgery

## 2020-06-07 ENCOUNTER — Encounter
Admission: RE | Admit: 2020-06-07 | Discharge: 2020-06-07 | Disposition: A | Discharge status: Home / Self Care | Payer: Medicare Other | Source: Ambulatory Visit | Attending: Surgery | Admitting: Surgery

## 2020-06-07 ENCOUNTER — Other Ambulatory Visit: Payer: Self-pay

## 2020-06-07 DIAGNOSIS — Z01812 Encounter for preprocedural laboratory examination: Secondary | ICD-10-CM | POA: Insufficient documentation

## 2020-06-07 NOTE — Patient Instructions (Addendum)
Your procedure is scheduled on:06-13-20 WEDNESDAY Report to the Registration Desk on the 1st floor of the [facility]-Then proceed to the 2nd floor Surgery Desk in the [place] To find out your arrival time, please call [phone] between 1PM - 3PM on:06-12-20 TUESDAY  REMEMBER: Instructions that are not followed completely may result in serious medical risk, up to and including death; or upon the discretion of your surgeon and anesthesiologist your surgery may need to be rescheduled.  Do not eat food after midnight the night before surgery.  No gum chewing, lozengers or hard candies.  You may however, drink CLEAR liquids up to 2 hours before you are scheduled to arrive for your surgery. Do not drink anything within 2 hours of your scheduled arrival time.  Clear liquids include: - water  - apple juice without pulp - gatorade - black coffee or tea (Do NOT add milk or creamers to the coffee or tea) Do NOT drink anything that is not on this list.  In addition, your doctor has ordered for you to drink the provided  Ensure Pre-Surgery Clear Carbohydrate Drink  Drinking this carbohydrate drink up to two hours before surgery helps to reduce insulin resistance and improve patient outcomes. Please complete drinking 2 hours prior to scheduled arrival time.  TAKE THESE MEDICATIONS THE MORNING OF SURGERY WITH A SIP OF WATER: -MYRBETRIQ -DETROL (TOLTERODINE) -PROZAC (FLUOXETINE)  One week prior to surgery: Stop Anti-inflammatories (NSAIDS) such as Advil, Aleve, Ibuprofen, Motrin, Naproxen, Naprosyn and Aspirin based products such as Excedrin, Goodys Powder, BC Powder-OK TO TAKE TYLENOL IF NEEDED  Stop ANY OVER THE COUNTER supplements until after surgery. (However, you may continue taking Vitamin B12 up until the day before surgery.)  No Alcohol for 24 hours before or after surgery.  No Smoking including e-cigarettes for 24 hours prior to surgery.  No chewable tobacco products for at  least 6 hours prior to surgery.  No nicotine patches on the day of surgery.  Do not use any "recreational" drugs for at least a week prior to your surgery.  Please be advised that the combination of cocaine and anesthesia may have negative outcomes, up to and including death. If you test positive for cocaine, your surgery will be cancelled.  On the morning of surgery brush your teeth with toothpaste and water, you may rinse your mouth with mouthwash if you wish. Do not swallow any toothpaste or mouthwash.  Do not wear jewelry, make-up, hairpins, clips or nail polish.  Do not wear lotions, powders, or perfumes.   Do not shave body from the neck down 48 hours prior to surgery just in case you cut yourself which could leave a site for infection.  Also, freshly shaved skin may become irritated if using the CHG soap.  Contact lenses, hearing aids and dentures may not be worn into surgery.  Do not bring valuables to the hospital. [facility] is not responsible for any missing/lost belongings or valuables.   Use CHG Soap as directed on instruction sheet  Notify your doctor if there is any change in your medical condition (cold, fever, infection).  Wear comfortable clothing (specific to your surgery type) to the hospital.  Plan for stool softeners for home use; pain medications have a tendency to cause constipation. You can also help prevent constipation by eating foods high in fiber such as fruits and vegetables and drinking plenty of fluids as your diet allows.  After surgery, you can help prevent lung complications by doing breathing  exercises.  Take deep breaths and cough every 1-2 hours. Your doctor may order a device called an Incentive Spirometer to help you take deep breaths. When coughing or sneezing, hold a pillow firmly against your incision with both hands. This is called "splinting." Doing this helps protect your incision. It also decreases belly discomfort.  If you are being  admitted to the hospital overnight, leave your suitcase in the car. After surgery it may be brought to your room.  If you are being discharged the day of surgery, you will not be allowed to drive home. You will need a responsible adult (18 years or older) to drive you home and stay with you that night.   If you are taking public transportation, you will need to have a responsible adult (18 years or older) with you. Please confirm with your physician that it is acceptable to use public transportation.   Please call the [place] [facility] at [phone] if you have any questions about these instructions.  Visitation Policy:  Patients undergoing a surgery or procedure may have one family member or support person with them as long as that person is not COVID-19 positive or experiencing its symptoms.  That person may remain in the waiting area during the procedure.  Inpatient Visitation:    Visiting hours are 7 a.m. to 8 p.m. Patients will be allowed one visitor. The visitor may change daily. The visitor must pass COVID-19 screenings, use hand sanitizer when entering and exiting the patient's room and wear a mask at all times, including in the patient's room. Patients must also wear a mask when staff or their visitor are in the room. Masking is required regardless of vaccination status. Systemwide, no visitors 17 or younger.

## 2020-06-11 ENCOUNTER — Other Ambulatory Visit: Payer: Self-pay

## 2020-06-11 ENCOUNTER — Other Ambulatory Visit
Admission: RE | Admit: 2020-06-11 | Discharge: 2020-06-11 | Disposition: A | Discharge status: Home / Self Care | Payer: Medicare Other | Source: Ambulatory Visit | Attending: Surgery | Admitting: Surgery

## 2020-06-11 DIAGNOSIS — Z01812 Encounter for preprocedural laboratory examination: Secondary | ICD-10-CM | POA: Insufficient documentation

## 2020-06-11 DIAGNOSIS — Z20822 Contact with and (suspected) exposure to covid-19: Secondary | ICD-10-CM | POA: Diagnosis not present

## 2020-06-12 LAB — SARS CORONAVIRUS 2 (TAT 6-24 HRS): SARS Coronavirus 2: NEGATIVE

## 2020-06-13 ENCOUNTER — Ambulatory Visit
Admission: RE | Admit: 2020-06-13 | Discharge: 2020-06-13 | Disposition: A | Discharge status: Home / Self Care | Payer: Medicare Other | Attending: Surgery | Admitting: Surgery

## 2020-06-13 ENCOUNTER — Encounter: Payer: Self-pay | Admitting: Surgery

## 2020-06-13 ENCOUNTER — Other Ambulatory Visit: Payer: Self-pay

## 2020-06-13 ENCOUNTER — Encounter
Admission: RE | Disposition: A | Discharge status: Home / Self Care | Payer: Self-pay | Source: Home / Self Care | Attending: Surgery

## 2020-06-13 ENCOUNTER — Ambulatory Visit: Payer: Medicare Other | Admitting: Certified Registered Nurse Anesthetist

## 2020-06-13 DIAGNOSIS — Z86010 Personal history of colonic polyps: Secondary | ICD-10-CM | POA: Diagnosis not present

## 2020-06-13 DIAGNOSIS — Z85828 Personal history of other malignant neoplasm of skin: Secondary | ICD-10-CM | POA: Insufficient documentation

## 2020-06-13 DIAGNOSIS — Z87891 Personal history of nicotine dependence: Secondary | ICD-10-CM | POA: Diagnosis not present

## 2020-06-13 DIAGNOSIS — G5601 Carpal tunnel syndrome, right upper limb: Secondary | ICD-10-CM | POA: Insufficient documentation

## 2020-06-13 DIAGNOSIS — Z791 Long term (current) use of non-steroidal anti-inflammatories (NSAID): Secondary | ICD-10-CM | POA: Diagnosis not present

## 2020-06-13 DIAGNOSIS — Z8379 Family history of other diseases of the digestive system: Secondary | ICD-10-CM | POA: Diagnosis not present

## 2020-06-13 DIAGNOSIS — Z8781 Personal history of (healed) traumatic fracture: Secondary | ICD-10-CM | POA: Insufficient documentation

## 2020-06-13 DIAGNOSIS — Z79899 Other long term (current) drug therapy: Secondary | ICD-10-CM | POA: Diagnosis not present

## 2020-06-13 DIAGNOSIS — Z8249 Family history of ischemic heart disease and other diseases of the circulatory system: Secondary | ICD-10-CM | POA: Diagnosis not present

## 2020-06-13 HISTORY — PX: CARPAL TUNNEL RELEASE: SHX101

## 2020-06-13 SURGERY — RELEASE, CARPAL TUNNEL, ENDOSCOPIC
Anesthesia: General | Site: Wrist | Laterality: Right

## 2020-06-13 MED ORDER — DEXAMETHASONE SODIUM PHOSPHATE 10 MG/ML IJ SOLN
INTRAMUSCULAR | Status: AC
  Filled 2020-06-13: qty 1

## 2020-06-13 MED ORDER — CEFAZOLIN SODIUM-DEXTROSE 2-4 GM/100ML-% IV SOLN
INTRAVENOUS | Status: AC
  Filled 2020-06-13: qty 100

## 2020-06-13 MED ORDER — CHLORHEXIDINE GLUCONATE 0.12 % MT SOLN
OROMUCOSAL | Status: AC
  Administered 2020-06-13: 15 mL
  Filled 2020-06-13: qty 15

## 2020-06-13 MED ORDER — HYDROCODONE-ACETAMINOPHEN 5-325 MG PO TABS: 1.0000 | ORAL_TABLET | ORAL | Status: DC | PRN

## 2020-06-13 MED ORDER — ACETAMINOPHEN 10 MG/ML IV SOLN: 1000.0000 mg | Freq: Once | INTRAVENOUS | Status: DC | PRN

## 2020-06-13 MED ORDER — LIDOCAINE HCL (CARDIAC) PF 100 MG/5ML IV SOSY
PREFILLED_SYRINGE | INTRAVENOUS | Status: DC | PRN
  Administered 2020-06-13: 50 mg via INTRAVENOUS

## 2020-06-13 MED ORDER — FENTANYL CITRATE (PF) 100 MCG/2ML IJ SOLN: 25.0000 ug | INTRAMUSCULAR | Status: DC | PRN

## 2020-06-13 MED ORDER — ONDANSETRON HCL 4 MG/2ML IJ SOLN: 4.0000 mg | Freq: Four times a day (QID) | INTRAMUSCULAR | Status: DC | PRN

## 2020-06-13 MED ORDER — ONDANSETRON HCL 4 MG/2ML IJ SOLN
INTRAMUSCULAR | Status: DC | PRN
  Administered 2020-06-13: 4 mg via INTRAVENOUS

## 2020-06-13 MED ORDER — FENTANYL CITRATE (PF) 100 MCG/2ML IJ SOLN
INTRAMUSCULAR | Status: DC | PRN
  Administered 2020-06-13 (×3): 25 ug via INTRAVENOUS

## 2020-06-13 MED ORDER — PROPOFOL 10 MG/ML IV BOLUS
INTRAVENOUS | Status: DC | PRN
  Administered 2020-06-13: 100 mg via INTRAVENOUS
  Administered 2020-06-13: 20 mg via INTRAVENOUS

## 2020-06-13 MED ORDER — FENTANYL CITRATE (PF) 100 MCG/2ML IJ SOLN
INTRAMUSCULAR | Status: AC
  Filled 2020-06-13: qty 2

## 2020-06-13 MED ORDER — ONDANSETRON HCL 4 MG/2ML IJ SOLN: 4.0000 mg | Freq: Once | INTRAMUSCULAR | Status: DC | PRN

## 2020-06-13 MED ORDER — FAMOTIDINE 20 MG PO TABS: 20.0000 mg | ORAL_TABLET | Freq: Once | ORAL | Status: AC

## 2020-06-13 MED ORDER — CEFAZOLIN SODIUM-DEXTROSE 2-4 GM/100ML-% IV SOLN
2.0000 g | INTRAVENOUS | Status: AC
  Administered 2020-06-13: 2 g via INTRAVENOUS

## 2020-06-13 MED ORDER — ORAL CARE MOUTH RINSE: 15.0000 mL | Freq: Once | OROMUCOSAL | Status: AC

## 2020-06-13 MED ORDER — ONDANSETRON HCL 4 MG PO TABS: 4.0000 mg | ORAL_TABLET | Freq: Four times a day (QID) | ORAL | Status: DC | PRN

## 2020-06-13 MED ORDER — OXYCODONE HCL 5 MG PO TABS: 5.0000 mg | ORAL_TABLET | Freq: Once | ORAL | Status: DC | PRN

## 2020-06-13 MED ORDER — OXYCODONE HCL 5 MG/5ML PO SOLN: 5.0000 mg | Freq: Once | ORAL | Status: DC | PRN

## 2020-06-13 MED ORDER — ONDANSETRON HCL 4 MG/2ML IJ SOLN
INTRAMUSCULAR | Status: AC
  Filled 2020-06-13: qty 2

## 2020-06-13 MED ORDER — METOCLOPRAMIDE HCL 10 MG PO TABS: 5.0000 mg | ORAL_TABLET | Freq: Three times a day (TID) | ORAL | Status: DC | PRN

## 2020-06-13 MED ORDER — EPHEDRINE SULFATE 50 MG/ML IJ SOLN
INTRAMUSCULAR | Status: DC | PRN
  Administered 2020-06-13: 10 mg via INTRAVENOUS
  Administered 2020-06-13: 5 mg via INTRAVENOUS

## 2020-06-13 MED ORDER — DEXAMETHASONE SODIUM PHOSPHATE 10 MG/ML IJ SOLN
INTRAMUSCULAR | Status: DC | PRN
  Administered 2020-06-13: 5 mg via INTRAVENOUS

## 2020-06-13 MED ORDER — FAMOTIDINE 20 MG PO TABS
ORAL_TABLET | ORAL | Status: AC
  Administered 2020-06-13: 20 mg via ORAL
  Filled 2020-06-13: qty 1

## 2020-06-13 MED ORDER — BUPIVACAINE HCL (PF) 0.5 % IJ SOLN
INTRAMUSCULAR | Status: DC | PRN
  Administered 2020-06-13: 10 mL

## 2020-06-13 MED ORDER — LACTATED RINGERS IV SOLN: INTRAVENOUS | Status: DC

## 2020-06-13 MED ORDER — CHLORHEXIDINE GLUCONATE 0.12 % MT SOLN: 15.0000 mL | Freq: Once | OROMUCOSAL | Status: AC

## 2020-06-13 MED ORDER — METOCLOPRAMIDE HCL 5 MG/ML IJ SOLN: 5.0000 mg | Freq: Three times a day (TID) | INTRAMUSCULAR | Status: DC | PRN

## 2020-06-13 SURGICAL SUPPLY — 26 items
BNDG COHESIVE 4X5 TAN STRL (GAUZE/BANDAGES/DRESSINGS) ×2 IMPLANT
BNDG ELASTIC 2X5.8 VLCR STR LF (GAUZE/BANDAGES/DRESSINGS) ×2 IMPLANT
BNDG ESMARK 4X12 TAN STRL LF (GAUZE/BANDAGES/DRESSINGS) ×2 IMPLANT
CANISTER SUCT 1200ML W/VALVE (MISCELLANEOUS) ×2 IMPLANT
CHLORAPREP W/TINT 26 (MISCELLANEOUS) ×2 IMPLANT
CORD BIP STRL DISP 12FT (MISCELLANEOUS) ×2 IMPLANT
COVER WAND RF STERILE (DRAPES) ×2 IMPLANT
DRAPE SURG 17X11 SM STRL (DRAPES) ×2 IMPLANT
FORCEPS JEWEL BIP 4-3/4 STR (INSTRUMENTS) ×2 IMPLANT
GAUZE SPONGE 4X4 12PLY STRL (GAUZE/BANDAGES/DRESSINGS) ×2 IMPLANT
GAUZE XEROFORM 1X8 LF (GAUZE/BANDAGES/DRESSINGS) ×2 IMPLANT
GLOVE BIO SURGEON STRL SZ8 (GLOVE) ×2 IMPLANT
GLOVE INDICATOR 8.0 STRL GRN (GLOVE) ×2 IMPLANT
GOWN STRL REUS W/ TWL LRG LVL3 (GOWN DISPOSABLE) ×1 IMPLANT
GOWN STRL REUS W/ TWL XL LVL3 (GOWN DISPOSABLE) ×1 IMPLANT
GOWN STRL REUS W/TWL LRG LVL3 (GOWN DISPOSABLE) ×1
GOWN STRL REUS W/TWL XL LVL3 (GOWN DISPOSABLE) ×1
KIT CARPAL TUNNEL (MISCELLANEOUS) ×1
KIT ESCP INSRT D SLOT CANN KN (MISCELLANEOUS) ×1 IMPLANT
KIT TURNOVER KIT A (KITS) ×2 IMPLANT
MANIFOLD NEPTUNE II (INSTRUMENTS) ×2 IMPLANT
NS IRRIG 500ML POUR BTL (IV SOLUTION) ×2 IMPLANT
PACK EXTREMITY ARMC (MISCELLANEOUS) ×2 IMPLANT
SPLINT WRIST LG RT TX900304 (SOFTGOODS) ×2 IMPLANT
STOCKINETTE IMPERVIOUS 9X36 MD (GAUZE/BANDAGES/DRESSINGS) ×2 IMPLANT
SUT PROLENE 4 0 PS 2 18 (SUTURE) ×2 IMPLANT

## 2020-06-13 NOTE — Anesthesia Preprocedure Evaluation (Signed)
Anesthesia Evaluation  Patient identified by MRN, date of birth, ID band Patient awake    Reviewed: Allergy & Precautions, NPO status , Patient's Chart, lab work & pertinent test results  History of Anesthesia Complications Negative for: history of anesthetic complications  Airway Mallampati: II  TM Distance: >3 FB Neck ROM: Full    Dental  (+) Edentulous Upper, Edentulous Lower   Pulmonary neg pulmonary ROS, neg sleep apnea, neg COPD, Patient abstained from smoking.Not current smoker, former smoker,    Pulmonary exam normal breath sounds clear to auscultation       Cardiovascular Exercise Tolerance: Good METShypertension, (-) CAD and (-) Past MI Normal cardiovascular exam(-) dysrhythmias  Rhythm:Regular Rate:Normal     Neuro/Psych PSYCHIATRIC DISORDERS Depression negative neurological ROS     GI/Hepatic negative GI ROS, Neg liver ROS, neg GERD  ,  Endo/Other  negative endocrine ROSneg diabetes  Renal/GU negative Renal ROS Bladder dysfunction      Musculoskeletal  (+) Arthritis ,   Abdominal   Peds negative pediatric ROS (+)  Hematology negative hematology ROS (+)   Anesthesia Other Findings Past Medical History: No date: Arthritis     Comment:  left heel/ knees and ankle, wears a knee brace               occasionally No date: Depression No date: Hot flashes No date: Hypertension No date: Incontinence     Comment:  URINARY No date: Motion sickness     Comment:  planes, back seat-cars No date: Seasonal allergies  Reproductive/Obstetrics negative OB ROS                             Anesthesia Physical  Anesthesia Plan  ASA: II  Anesthesia Plan: General   Post-op Pain Management:    Induction: Intravenous  PONV Risk Score and Plan: 3 and Ondansetron, Dexamethasone and Treatment may vary due to age or medical condition  Airway Management Planned: LMA  Additional  Equipment: None  Intra-op Plan:   Post-operative Plan: Extubation in OR  Informed Consent: I have reviewed the patients History and Physical, chart, labs and discussed the procedure including the risks, benefits and alternatives for the proposed anesthesia with the patient or authorized representative who has indicated his/her understanding and acceptance.     Dental advisory given  Plan Discussed with: CRNA  Anesthesia Plan Comments: (Discussed risks of anesthesia with patient, including PONV, sore throat, lip/dental damage. Rare risks discussed as well, such as cardiorespiratory and neurological sequelae. Patient understands.)        Anesthesia Quick Evaluation

## 2020-06-13 NOTE — Transfer of Care (Signed)
Immediate Anesthesia Transfer of Care Note  Patient: Cynthia Ritter  Procedure(s) Performed: CARPAL TUNNEL RELEASE ENDOSCOPIC (Right Wrist)  Patient Location: PACU  Anesthesia Type:General  Level of Consciousness: drowsy  Airway & Oxygen Therapy: Patient connected to face mask oxygen  Post-op Assessment: Post -op Vital signs reviewed and stable  Post vital signs: stable  Last Vitals:  Vitals Value Taken Time  BP 122/65 06/13/20 1149  Temp    Pulse 63 06/13/20 1151  Resp 12 06/13/20 1151  SpO2 95 % 06/13/20 1151  Vitals shown include unvalidated device data.  Last Pain:  Vitals:   06/13/20 1017  TempSrc: Temporal  PainSc: 0-No pain         Complications: No complications documented.

## 2020-06-13 NOTE — Discharge Instructions (Addendum)
Orthopedic discharge instructions: Keep dressing dry and intact. Keep hand elevated above heart level. May shower after dressing removed on postop day 4 (Sunday). Cover sutures with Band-Aids after drying off, then reapply Velcro splint. Apply ice to affected area frequently. Take Aleve 1-2 tabs BID OR ibuprofen 600 mg TID with meals for 7-10 days, then as necessary. Take ES Tylenol or pain medication as prescribed when needed.  Return for follow-up in 10-14 days or as scheduled.  AMBULATORY SURGERY  DISCHARGE INSTRUCTIONS   1) The drugs that you were given will stay in your system until tomorrow so for the next 24 hours you should not:  A) Drive an automobile B) Make any legal decisions C) Drink any alcoholic beverage   2) You may resume regular meals tomorrow.  Today it is better to start with liquids and gradually work up to solid foods.  You may eat anything you prefer, but it is better to start with liquids, then soup and crackers, and gradually work up to solid foods.   3) Please notify your doctor immediately if you have any unusual bleeding, trouble breathing, redness and pain at the surgery site, drainage, fever, or pain not relieved by medication.    4) Additional Instructions:        Please contact your physician with any problems or Same Day Surgery at [phone], Monday through Friday 6 am to 4 pm, or [place] [facility] number at [phone].  Endoscopic Carpal Tunnel Release, Care After This sheet gives you information about how to care for yourself after your procedure. Your health care provider may also give you more specific instructions. If you have problems or questions, contact your health care provider. What can I expect after the procedure? After the procedure, it is common to have:  Pain.  Swelling.  Wrist stiffness.  Bruising. Follow these instructions at home: Medicines  Take over-the-counter and prescription medicines only  as told by your health care provider.  Ask your health care provider if the medicine prescribed to you: ? Requires you to avoid driving or using machinery. ? Can cause constipation. You may need to take these actions to prevent or treat constipation:  Drink enough fluid to keep your urine pale yellow.  Take over-the-counter or prescription medicines.  Eat foods that are high in fiber, such as beans, whole grains, and fresh fruits and vegetables.  Limit foods that are high in fat and processed sugars, such as fried or sweet foods. Bathing  Do not take baths, swim, or use a hot tub until your health care provider approves. Ask your health care provider if you may take showers.  Keep your bandage (dressing) dry until your health care provider says it can be removed. Cover it with a watertight covering when you take a bath or a shower. If you have a splint or brace:  Wear the splint or brace as told by your health care provider. Remove it only as told by your health care provider.  Loosen the splint or brace if your fingers tingle, become numb, or turn cold and blue.  Keep the splint or brace clean.  If the splint or brace is not waterproof: ? Do not let it get wet. ? Cover it with a watertight covering when you take a bath or a shower. Incision care  After the compression bandage has been removed, follow instructions from your health care provider about how to take care of your incision. Make sure you: ? Wash your  hands with soap and water for at least 20 seconds before and after you change your bandage (dressing). If soap and water are not available, use hand sanitizer. ? Change your dressing as told by your health care provider. ? Leave stitches (sutures), skin glue, or adhesive strips in place. These skin closures may need to stay in place for 2 weeks or longer. If adhesive strip edges start to loosen and curl up, you may trim the loose edges. Do not remove adhesive strips  completely unless your health care provider tells you to do that.  Check your incision area every day for signs of infection. Check for: ? Redness. ? More swelling or pain. ? Fluid or blood. ? Warmth. ? Pus or a bad smell.   Managing pain, stiffness, and swelling  If directed, put ice on your wrist area. To do this: ? If you have a removable splint or brace, remove it as told by your health care provider. ? Put ice in a plastic bag. ? Place a towel between your skin and the bag or between your bandage and the bag. ? Leave the ice on for 20 minutes, 2-3 times a day. Do not fall asleep with the ice pack on your skin. ? Remove the ice if your skin turns bright red. This is very important. If you cannot feel pain, heat, or cold, you have a greater risk of damage to the area.  Move your fingers often to reduce stiffness and swelling.  Raise your wrist above the level of your heart while you are sitting or lying down.   Activity  Do not drive until your health care provider approves.  Use your hand carefully. Do not do activities that cause pain. You should be able to do light activities with your hand.  Do not lift with your affected hand until your health care provider approves.  Avoid pulling and pushing with the injured arm.  Return to your normal activities as told by your health care provider. Ask your health care provider what activities are safe for you.  If physical therapy was prescribed, do exercises as told by your therapist. Physical therapy can help you heal faster and regain movement. General instructions  Do not use any products that contain nicotine or tobacco, such as cigarettes, e-cigarettes, and chewing tobacco. These can delay incision healing after surgery. If you need help quitting, ask your health care provider.  Keep all follow-up visits. This is important. These include visits for physical therapy. Contact a health care provider if:  You have redness around  your incision.  You have more swelling or pain.  You have fluid or blood coming from your incision.  Your incision feels warm to the touch.  You have pus or a bad smell coming from your incision.  You have a fever or chills.  You have pain that does not get better with medicine.  Your carpal tunnel symptoms do not go away after 2 months.  Your carpal tunnel symptoms go away and then come back. Get help right away if:  You have pain or numbness that is getting worse.  Your fingers or fingertips become very pale or bluish in color.  You are not able to move your fingers. Summary  After the procedure, it is common to have pain, swelling, stiffness, or bruising. Call your health care provider if you have pain that is not controlled with medicine.  If you have a splint or brace, wear it as told by  your health care provider. Move your fingers often, and keep your hand above the level of your heart when resting.  Return to your normal activities as told by your health care provider. Ask your health care provider what activities are safe for you. You should be able to do light activities with your hand.  Contact a health care provider if you have numbness, weakness, a fever, or any signs of infection. This information is not intended to replace advice given to you by your health care provider. Make sure you discuss any questions you have with your health care provider. Document Revised: 08/25/2019 Document Reviewed: 08/25/2019 Elsevier Patient Education  2021 [employer].

## 2020-06-13 NOTE — Anesthesia Procedure Notes (Signed)
Procedure Name: LMA Insertion Performed by: Louann Sjogren, CRNA Pre-anesthesia Checklist: Patient identified, Patient being monitored, Timeout performed, Emergency Drugs available and Suction available Patient Re-evaluated:Patient Re-evaluated prior to induction Oxygen Delivery Method: Circle system utilized Preoxygenation: Pre-oxygenation with 100% oxygen Induction Type: IV induction Ventilation: Mask ventilation without difficulty LMA: LMA inserted LMA Size: 4.0 Tube type: Oral Number of attempts: 1 Placement Confirmation: positive ETCO2 and breath sounds checked- equal and bilateral Tube secured with: Tape Dental Injury: Teeth and Oropharynx as per pre-operative assessment

## 2020-06-13 NOTE — H&P (Signed)
History of Present Illness: Cynthia Ritter is a 87 y.o. who presents today for history and physical. She is to undergo a endoscopic right carpal tunnel release on 06/13/2020. Was last seen on the clinic on 05/02/2020. There have been no change in her condition since that time.  Patient has a history of bilateral hand and wrist pain and paresthesias, right more symptomatic than left. The patient notes that the symptoms have been present for well over a year but have gradually worsened over the past few months. She notes that her hands will go to sleep when she is driving and frequently will awaken her from sleep at night. She saw her primary care provider, who ordered an EMG of both upper extremities, then referred her to orthopedics for further evaluation and treatment. The patient has been wearing a Velcro wrist immobilizer at night which she does find to be of some benefit. However she notes an aching pain in her right thumb area when not wearing the splint during the day which she finds aggravating. Recently, she fell and broke her hip which was "pinned" by Dr. Posey Pronto. She is recuperating from this, but has had to use a walker which she feels is further aggravated her wrist symptoms. She currently no longer is using a walker but a cane in the right hand.  Past Medical History: . Allergic rhinitis  . Arthritis  . Bilateral hand numbness 03/08/2020  . Carpal tunnel syndrome on both sides 02/10/2020  . Cataract cortical, senile  . Closed fracture of left hip with routine healing 04/16/2020  left femoral neck fracture, s/p pinning  . Colon polyps  . Depression  . Essential hypertension 09/14/2017  . Femoral neck fracture (CMS-HCC) 04/16/2020  . Hyperlipidemia  . Insomnia  . Lipoma of left lower extremity 02/10/2020  . Osteopenia  . Primary osteoarthritis of right knee 01/28/2017  . Primary osteoarthritis of right knee 01/28/2017  . Squamous cell cancer of multiple sites of skin of upper arm  Followed by  Dr. Phillip Heal  . Trigger index finger of right hand 02/10/2020  . Urinary incontinence, mixed   Past Surgical History:  . BIOPSY SKIN ARM (Multiple biopsies)  . BIOPSY SKIN FACE 2018 (Squamous cell of nose)  . Cannulated Hip Pinning Left hip 04/18/2020   . CATARACT EXTRACTION Bilateral 2017 (Dr. Wallace Going)  . CHOLECYSTECTOMY 1991  . COLONOSCOPY x 2  . ENDOMETRIAL BIOPSY 2003 (Negative).  . EXTRACTION TEETH 12/2017   Past Family History: . Liver disease Sister (Non-alcoholic)  . Ulcers Sister  . Heart failure Mother  . Heart failure Father   Medications: . cyanocobalamin (VITAMIN B12) 1000 MCG tablet Take 1,000 mcg by mouth once daily  . diclofenac (VOLTAREN) 1 % topical gel Apply topically 2 (two) times daily 300 g 1  . FLUoxetine (PROZAC) 10 MG capsule Take 1 capsule (10 mg total) by mouth once daily 90 capsule 1  . FLUoxetine (PROZAC) 20 MG capsule Take 1 capsule (20 mg total) by mouth once daily 90 capsule 1  . losartan (COZAAR) 25 MG tablet Take 1 tablet (25 mg total) by mouth once daily for 90 days 90 tablet 1  . mirabegron (MYRBETRIQ) 25 mg ER Tablet Take 1 tablet (25 mg total) by mouth once daily for 180 days 90 tablet 1  . naproxen sodium (ALEVE) 220 MG tablet Take by mouth  . tolterodine (DETROL LA) 4 MG LA capsule Take 1 capsule (4 mg total) by mouth once daily 90 capsule 1  Allergies: No Known Allergies   Review of Systems: A comprehensive 14 point ROS was performed, reviewed, and the pertinent orthopaedic findings are documented in the HPI.  Physical Exam: BP (!) 154/88  Ht 162.3 cm (5' 3.88")  Wt 91.3 kg (201 lb 3.2 oz)  LMP (LMP Unknown)  BMI 34.67 kg/m   General: Well-developed well-nourished female seen in no acute distress.   HEENT: Atraumatic,normocephalic. Pupils are equal and reactive to light. Oropharynx is clear with moist mucosa  Lungs: Clear to auscultation bilaterally   Cardiovascular: Regular rate and rhythm. Normal S1, S2. Grade 2/6  systolic murmur. No appreciable gallops or rubs. Peripheral pulses are palpable.  Abdomen: Soft, non-tender, nondistended. Bowel sounds present  Right hand/wrist exam: Skin inspection of the right hand and wrist is unremarkable. No swelling, erythema, ecchymosis, abrasions, or other skin abnormalities are identified. She has no tenderness to palpation of the dorsal or palmar aspects of the hand and wrist. Her wrist exhibits full active and passive range of motion without any pain or catching. She is able to actively flex and extend all digits fully without any pain or triggering. She is neurovascularly intact to all digits. She has a negative Phalen's test, but a mildly positive Tinel's over the carpal tunnel.  Neurological: The patient is alert and oriented Sensation to light touch appears to be intact and within normal limits Gross motor strength appeared to be equal to 5/5  Vascular: Peripheral pulses felt to be palpable. Capillary refill appears to be intact and within normal limits  EMG: A recent EMG of both upper extremities has been obtained and is available for review. By report, the study demonstrates evidence of bilateral chronic carpal tunnel syndrome, right more severe than the left.  Impression: Carpal tunnel syndrome right wrist  Plan:  The treatment options were discussed with the patient. In addition, patient educational materials were provided regarding the diagnosis and treatment options. The patient is quite frustrated by her persistent symptoms and functional limitations, especially as it pertains to her left wrist, and is ready to consider more aggressive treatment options. Therefore, I have recommended a surgical procedure, specifically an endoscopic right carpal tunnel release. The procedure was discussed with the patient, as were the potential risks (including bleeding, infection, nerve and/or blood vessel injury, persistent or recurrent pain/paresthesias, weakness of  grip, need for further surgery, blood clots, strokes, heart attacks and/or arhythmias, pneumonia, etc.) and benefits. The patient states his/her understanding and wishes to proceed. All of the patient's questions and concerns were answered. She can call any time with further concerns. She will follow up post-surgery, routine.   H&P reviewed and patient re-examined. No changes.

## 2020-06-13 NOTE — Op Note (Signed)
06/13/2020  11:58 AM  Patient:   Cynthia Ritter  Pre-Op Diagnosis:   Right carpal tunnel syndrome.  Post-Op Diagnosis:   Same.  Procedure:   Endoscopic right carpal tunnel release.  Surgeon:   Pascal Lux, MD  Anesthesia:   General LMA  Findings:   As above.  Complications:   None  EBL:   0 cc  Fluids:   400 cc crystalloid  TT:   15 minutes at 250 mmHg  Drains:   None  Closure:   4-0 Prolene interrupted sutures  Brief Clinical Note:   The patient is an 87 year old female with a long history of progressively worsening pain and paresthesias to her right hand. Her symptoms have persisted despite medications, activity modification, etc. Her history and examination are consistent with carpal tunnel syndrome, confirmed by EMG. The patient presents at this time for an endoscopic right carpal tunnel release.   Procedure:   The patient was brought into the operating room and lain in the supine position. After adequate general laryngeal mask anesthesia was obtained, the right hand and upper extremity were prepped with ChloraPrep solution before being draped sterilely. Preoperative antibiotics were administered. A timeout was performed to verify the appropriate surgical site before the limb was exsanguinated with an Esmarch and the tourniquet inflated to 250 mmHg. An approximately 1.5-2 cm incision was made over the volar wrist flexion crease, centered over the palmaris longus tendon. The incision was carried down through the subcutaneous tissues with care taken to identify and protect any neurovascular structures. The distal forearm fascia was penetrated just proximal to the transverse carpal ligament. The soft tissues were released off the superficial and deep surfaces of the distal forearm fascia and this was released proximally for 3-4 cm under direct visualization.  Attention was directed distally. The Soil scientist was passed beneath the transverse carpal ligament along the ulnar  aspect of the carpal tunnel and used to release any adhesions as well as to remove any adherent synovial tissue before first the smaller then the larger of the two dilators were passed beneath the transverse carpal ligament along the ulnar margin of the carpal tunnel. The slotted cannula was introduced and the endoscope was placed into the slotted cannula and the undersurface of the transverse carpal ligament visualized. The distal margin of the transverse carpal ligament was marked by placing a 25-gauge needle percutaneously at [place] cardinal point so that it entered the distal portion of the slotted cannula. Under endoscopic visualization, the transverse carpal ligament was released from proximal to distal using the end-cutting blade. A second pass was performed to ensure complete release of the ligament. The adequacy of release was verified both endoscopically and by palpation using the freer elevator.  The wound was irrigated thoroughly with sterile saline solution before being closed using 4-0 Prolene interrupted sutures. A total of 10 cc of 0.5% plain Sensorcaine was injected in and around the incision before a sterile bulky dressing was applied to the wound. The patient was placed into a volar wrist splint before being awakened, extubated, and returned to the recovery room in satisfactory condition after tolerating the procedure well.

## 2020-06-13 NOTE — Anesthesia Postprocedure Evaluation (Signed)
Anesthesia Post Note  Patient: Cynthia Ritter  Procedure(s) Performed: CARPAL TUNNEL RELEASE ENDOSCOPIC (Right Wrist)  Patient location during evaluation: PACU Anesthesia Type: General Level of consciousness: awake and alert Pain management: pain level controlled Vital Signs Assessment: post-procedure vital signs reviewed and stable Respiratory status: spontaneous breathing, nonlabored ventilation, respiratory function stable and patient connected to nasal cannula oxygen Cardiovascular status: blood pressure returned to baseline and stable Postop Assessment: no apparent nausea or vomiting Anesthetic complications: no   No complications documented.   Last Vitals:  Vitals:   06/13/20 1244 06/13/20 1305  BP: (!) 168/83 112/82  Pulse: 80 75  Resp: 16 16  Temp: (!) 36.4 C 36.7 C  SpO2: 98% 98%    Last Pain:  Vitals:   06/13/20 1305  TempSrc: Oral  PainSc: 0-No pain                 Arita Miss

## 2020-06-14 ENCOUNTER — Encounter: Payer: Self-pay | Admitting: Surgery

## 2020-06-14 NOTE — Progress Notes (Signed)
Spoke with patient on 06/14/20 at 0847. Patient called and left a voicemail yesterday evening. Patient stated she was having numbness in her fingers, they was swelling and warm to the touch. Patient states she loosed the brace and put an ice pack on it and this morning it is better. She is able to move her fingers and states the swelling has went down. I instructed the patient to call Dr Nicholaus Bloom office and let them know what was going on. Voiced understanding

## 2020-10-12 ENCOUNTER — Encounter: Payer: Self-pay | Admitting: Emergency Medicine

## 2020-10-12 ENCOUNTER — Other Ambulatory Visit: Payer: Self-pay

## 2020-10-12 ENCOUNTER — Ambulatory Visit
Admission: EM | Admit: 2020-10-12 | Discharge: 2020-10-12 | Disposition: A | Discharge status: Home / Self Care | Payer: Medicare Other | Attending: Emergency Medicine | Admitting: Emergency Medicine

## 2020-10-12 DIAGNOSIS — R21 Rash and other nonspecific skin eruption: Secondary | ICD-10-CM | POA: Diagnosis not present

## 2020-10-12 MED ORDER — TRIAMCINOLONE ACETONIDE 0.1 % EX CREA
1.0000 | TOPICAL_CREAM | Freq: Two times a day (BID) | CUTANEOUS | 0 refills | Status: DC
Start: 2020-10-12 — End: 2021-06-25

## 2020-10-12 MED ORDER — TRIAMCINOLONE ACETONIDE 0.1 % EX CREA: 1.0000 "application " | TOPICAL_CREAM | Freq: Two times a day (BID) | CUTANEOUS | 0 refills | Status: DC

## 2020-10-12 NOTE — ED Provider Notes (Signed)
HPI  SUBJECTIVE:  Cynthia Ritter is a 87 y.o. female who presents with an intensely pruritic, erythematous rash on her right forearm starting yesterday.  She was working in the yard yesterday, pulled up an unknown vine.  No new lotions, soaps, detergents, change in medications, recent antibiotics.  No new foods.  No tongue or lip swelling, shortness of breath, difficulty breathing.  No rash elsewhere.  She tried applying bleach and calamine without improvement in her symptoms.  No aggravating factors.  She has a past medical history of hypertension.  No history of diabetes.  [id number], Victoriano Lain, NP   Past Medical History:  Diagnosis Date   Arthritis    left heel/ knees and ankle, wears a knee brace occasionally   Depression    Hot flashes    Hypertension    Incontinence    URINARY   Motion sickness    planes, back seat-cars   Seasonal allergies     Past Surgical History:  Procedure Laterality Date   CARPAL TUNNEL RELEASE Right 06/13/2020   Procedure: CARPAL TUNNEL RELEASE ENDOSCOPIC;  Surgeon: Corky Mull, MD;  Location: [facility];  Service: Orthopedics;  Laterality: Right;   CATARACT EXTRACTION W/PHACO Right 09/26/2015   Procedure: CATARACT EXTRACTION PHACO AND INTRAOCULAR LENS PLACEMENT (IOC);  Surgeon: Leandrew Koyanagi, MD;  Location: [place];  Service: Ophthalmology;  Laterality: Right;   CATARACT EXTRACTION W/PHACO Left 10/24/2015   Procedure: CATARACT EXTRACTION PHACO AND INTRAOCULAR LENS PLACEMENT ([place]) left eye;  Surgeon: Leandrew Koyanagi, MD;  Location: [facility];  Service: Ophthalmology;  Laterality: Left;   CHOLECYSTECTOMY     COLONOSCOPY     X2   HIP PINNING,CANNULATED Left 04/18/2020   Procedure: CANNULATED HIP PINNING;  Surgeon: Leim Fabry, MD;  Location: [facility];  Service: Orthopedics;  Laterality: Left;    Family History  Problem Relation Age of Onset   Heart failure Mother    Hypertension Mother    Asthma Mother    Parkinson's  disease Mother    Diabetes Mother    Heart failure Father    Cirrhosis Sister     Social History   Tobacco Use   Smoking status: Former    Pack years: 0.00   Smokeless tobacco: Never   Tobacco comments:    smoked in college  Vaping Use   Vaping Use: Never used  Substance Use Topics   Alcohol use: Yes    Comment: may drink 1x/month   Drug use: Never    No current facility-administered medications for this encounter.  Current Outpatient Medications:    alendronate (FOSAMAX) 70 MG tablet, , Disp: , Rfl:    FLUoxetine (PROZAC) 10 MG capsule, Take 10 mg by mouth every morning. Take with 20mg  capsule to equal 30mg  total, Disp: , Rfl:    FLUoxetine (PROZAC) 20 MG capsule, Take 20 mg by mouth daily. Take with 10mg  capsule to equal 30mg  total, Disp: , Rfl:    losartan (COZAAR) 25 MG tablet, Take 25 mg by mouth every morning., Disp: , Rfl:    MYRBETRIQ 25 MG TB24 tablet, Take 25 mg by mouth every morning., Disp: , Rfl:    tolterodine (DETROL LA) 4 MG 24 hr capsule, Take 4 mg by mouth every morning., Disp: , Rfl:    vitamin B-12 (CYANOCOBALAMIN) 1000 MCG tablet, Take 1,000 mcg by mouth every other day., Disp: , Rfl:    diclofenac Sodium (VOLTAREN) 1 % GEL, Apply 1 application topically 3 (three) times daily as  needed for pain., Disp: , Rfl:    fluticasone (FLONASE) 50 MCG/ACT nasal spray, Place 2 sprays into both nostrils daily. (Patient taking differently: Place 2 sprays into both nostrils at bedtime as needed (allergies/congestion.).), Disp: 16 g, Rfl: 0   naproxen sodium (ALEVE) 220 MG tablet, Take 220-440 mg by mouth 2 (two) times daily as needed (pain)., Disp: , Rfl:    oxymetazoline (AFRIN) 0.05 % nasal spray, Place 1 spray into both nostrils at bedtime as needed for congestion., Disp: , Rfl:    triamcinolone cream (KENALOG) 0.1 %, Apply 1 application topically 2 (two) times daily. Apply for 2 weeks. May use on face, Disp: 30 g, Rfl: 0  No Known Allergies   ROS  As noted in HPI.    Physical Exam  BP 140/90 (BP Location: Left Arm)   Pulse 66   Temp 98.4 F (36.9 C) (Oral)   Resp 14   Ht 5\' 4"  (1.626 m)   Wt 88.5 kg   SpO2 99%   BMI 33.47 kg/m   Constitutional: Well developed, well nourished, no acute distress Eyes:  EOMI, conjunctiva normal bilaterally HENT: Normocephalic, atraumatic,mucus membranes moist Respiratory: Normal inspiratory effort Cardiovascular: Normal rate GI: nondistended skin: Erythematous nontender raised wheals and papules in linear distribution right forearm.  Skin intact.  No crusting.      Musculoskeletal: no deformities Neurologic: Alert & oriented x 3, no focal neuro deficits Psychiatric: Speech and behavior appropriate   ED Course   Medications - No data to display  No orders of the defined types were placed in this encounter.   No results found for this or any previous visit (from the past 24 hour(s)). No results found.  ED Clinical Impression  1. Rash      ED Assessment/Plan  Difficult to tell if this is poison ivy dermatitis versus urticaria.  It appears to have characteristics of both. we will have her try Zanfel or Tecnu, Claritin or Zyrtec, and we will send home with triamcinolone.  Would like to withhold oral steroids for now.  May return here or follow-up with PMD if she gets worse and we can consider a 12-day steroid taper at that time.  Discussed  MDM, treatment plan, and plan for follow-up with patient.  patient agrees with plan.   Meds ordered this encounter  Medications   DISCONTD: triamcinolone cream (KENALOG) 0.1 %    Sig: Apply 1 application topically 2 (two) times daily. Apply for 2 weeks. May use on face    Dispense:  30 g    Refill:  0   triamcinolone cream (KENALOG) 0.1 %    Sig: Apply 1 application topically 2 (two) times daily. Apply for 2 weeks. May use on face    Dispense:  30 g    Refill:  0      *This clinic note was created using [employer]. Therefore, there  may be occasional mistakes despite careful proofreading.  ?    Melynda Ripple, MD 10/12/20 [phone]

## 2020-10-12 NOTE — Discharge Instructions (Addendum)
Use TecNu before going out in areas with known poison ivy/oak.  This will help prevent you from getting poison ivy/oak.  If you get a rash, you can use Zanfel or TecNu extreme to deactivate the oil, which will stop the rash from spreading and help with the itching.  Apply antibacterial ointment on scabbed areas to help prevent infection.  Try the steroid cream first.  If this spreads and becomes extensive, we can start you on oral steroids at that time.  You may take Claritin or Zyrtec during the day. Dissolve 1 packet (or tablet) of Domeboro (aluminum acetate) in 1 pint of luke-warm water. Soak the affected areas with luke-warm Domeboro solution for 5-10 minutes twice daily. You may use apply gauze soaked in the domeboro.  Gently pat dry, Then apply the steriod / antibiotic cream. You may also take oatmeal baths with Aveeno oatmeal (1 cup in half full bathtub) or cornstarch/baking soda (1 cup each in half full bathtub). To prevent the oatmeal from caking in pipes, place it in a tied sock before dropping it into the bathtub.  Go to www.goodrx.com to look up your medications. This will give you a list of where you can find your prescriptions at the most affordable prices. Or ask the pharmacist what the cash price is, or if they have any other discount programs available to help make your medication more affordable. This can be less expensive than what you would pay with insurance.

## 2020-10-12 NOTE — ED Triage Notes (Signed)
Patient c/o itchy red bumps that started on her right forearm today.

## 2020-11-10 ENCOUNTER — Encounter: Payer: Self-pay | Admitting: Emergency Medicine

## 2020-11-10 ENCOUNTER — Ambulatory Visit
Admission: EM | Admit: 2020-11-10 | Discharge: 2020-11-10 | Disposition: A | Discharge status: Home / Self Care | Payer: Medicare Other | Attending: Physician Assistant | Admitting: Physician Assistant

## 2020-11-10 ENCOUNTER — Other Ambulatory Visit: Payer: Self-pay

## 2020-11-10 DIAGNOSIS — R059 Cough, unspecified: Secondary | ICD-10-CM | POA: Insufficient documentation

## 2020-11-10 DIAGNOSIS — U071 COVID-19: Secondary | ICD-10-CM | POA: Diagnosis not present

## 2020-11-10 DIAGNOSIS — Z87891 Personal history of nicotine dependence: Secondary | ICD-10-CM | POA: Insufficient documentation

## 2020-11-10 DIAGNOSIS — J069 Acute upper respiratory infection, unspecified: Secondary | ICD-10-CM | POA: Diagnosis not present

## 2020-11-10 MED ORDER — BENZONATATE 100 MG PO CAPS: 200.0000 mg | ORAL_CAPSULE | Freq: Three times a day (TID) | ORAL | 0 refills | Status: DC

## 2020-11-10 MED ORDER — IPRATROPIUM BROMIDE 0.06 % NA SOLN: 2.0000 | Freq: Four times a day (QID) | NASAL | 12 refills | Status: DC

## 2020-11-10 NOTE — Discharge Instructions (Addendum)
Isolate at home pending the results of your COVID test.  If you test positive then you will have to quarantine for 5 days from the start of your symptoms.  After 5 days you can break quarantine if your symptoms have improved and you have not had a fever for 24 hours without taking Tylenol or ibuprofen.  Use over-the-counter Tylenol and ibuprofen as needed for body aches and fever.  Use the Tessalon Perles during the day as needed for cough.  Use the Atrovent nasal spray, 2 squirts each nostril every 6 hours, as needed for nasal congestion.  If you develop any increased shortness of breath-especially at rest, you are unable to speak in full sentences, or is a late sign your lips are turning blue you need to go the ER for evaluation.

## 2020-11-10 NOTE — ED Provider Notes (Signed)
[facility]    CSN: [id number] Arrival date & time: 11/10/20  1132      History   Chief Complaint Chief Complaint  Patient presents with   Cough   Nasal Congestion    HPI CARNELL CASAMENTO is a 87 y.o. female.   HPI  87 year old female here for evaluation of respiratory complaints.  Patient reports that she has been experiencing nasal congestion, runny nose, and a productive cough for a white to clear sputum for the last 2 days.  Her nasal discharge is clear to white in color.  She denies ear pain or sore throat, body aches, shortness of breath or wheezing, or GI complaints.  She has been vaccinated and received 1 booster for COVID.  Past Medical History:  Diagnosis Date   Arthritis    left heel/ knees and ankle, wears a knee brace occasionally   Depression    Hot flashes    Hypertension    Incontinence    URINARY   Motion sickness    planes, back seat-cars   Seasonal allergies     Patient Active Problem List   Diagnosis Date Noted   Femoral neck fracture ([place]) 04/16/2020   Hypertension    Depression    Incontinence     Past Surgical History:  Procedure Laterality Date   CARPAL TUNNEL RELEASE Right 06/13/2020   Procedure: CARPAL TUNNEL RELEASE ENDOSCOPIC;  Surgeon: Corky Mull, MD;  Location: [facility];  Service: Orthopedics;  Laterality: Right;   CATARACT EXTRACTION W/PHACO Right 09/26/2015   Procedure: CATARACT EXTRACTION PHACO AND INTRAOCULAR LENS PLACEMENT (IOC);  Surgeon: Leandrew Koyanagi, MD;  Location: [place];  Service: Ophthalmology;  Laterality: Right;   CATARACT EXTRACTION W/PHACO Left 10/24/2015   Procedure: CATARACT EXTRACTION PHACO AND INTRAOCULAR LENS PLACEMENT ([place]) left eye;  Surgeon: Leandrew Koyanagi, MD;  Location: [place];  Service: Ophthalmology;  Laterality: Left;   CHOLECYSTECTOMY     COLONOSCOPY     X2   HIP PINNING,CANNULATED Left 04/18/2020   Procedure: CANNULATED HIP PINNING;  Surgeon: Leim Fabry, MD;  Location: [facility];  Service: Orthopedics;  Laterality: Left;    OB History   No obstetric history on file.      Home Medications    Prior to Admission medications   Medication Sig Start Date End Date Taking? Authorizing Provider  benzonatate (TESSALON) 100 MG capsule Take 2 capsules (200 mg total) by mouth every 8 (eight) hours. 11/10/20  Yes Margarette Canada, NP  ipratropium (ATROVENT) 0.06 % nasal spray Place 2 sprays into both nostrils 4 (four) times daily. 11/10/20  Yes Margarette Canada, NP  alendronate (FOSAMAX) 70 MG tablet  06/14/20   [provider]  diclofenac Sodium (VOLTAREN) 1 % GEL Apply 1 application topically 3 (three) times daily as needed for pain. 04/17/20   [provider]  FLUoxetine (PROZAC) 10 MG capsule Take 10 mg by mouth every morning. Take with 20mg  capsule to equal 30mg  total    [provider]  FLUoxetine (PROZAC) 20 MG capsule Take 20 mg by mouth daily. Take with 10mg  capsule to equal 30mg  total    [provider]  fluticasone (FLONASE) 50 MCG/ACT nasal spray Place 2 sprays into both nostrils daily. Patient taking differently: Place 2 sprays into both nostrils at bedtime as needed (allergies/congestion.). 04/20/20   Antonieta Pert, MD  losartan (COZAAR) 25 MG tablet Take 25 mg by mouth every morning. 01/14/20   [provider]  MYRBETRIQ 25 MG  TB24 tablet Take 25 mg by mouth every morning. 01/15/20   [provider]  naproxen sodium (ALEVE) 220 MG tablet Take 220-440 mg by mouth 2 (two) times daily as needed (pain).    [provider]  oxymetazoline (AFRIN) 0.05 % nasal spray Place 1 spray into both nostrils at bedtime as needed for congestion.    [provider]  tolterodine (DETROL LA) 4 MG 24 hr capsule Take 4 mg by mouth every morning.    [provider]  triamcinolone cream (KENALOG) 0.1 % Apply 1 application topically 2 (two) times daily. Apply for 2 weeks. May use on face 10/12/20    Melynda Ripple, MD  vitamin B-12 (CYANOCOBALAMIN) 1000 MCG tablet Take 1,000 mcg by mouth every other day.    [provider]    Family History Family History  Problem Relation Age of Onset   Heart failure Mother    Hypertension Mother    Asthma Mother    Parkinson's disease Mother    Diabetes Mother    Heart failure Father    Cirrhosis Sister     Social History Social History   Tobacco Use   Smoking status: Former    Pack years: 0.00   Smokeless tobacco: Never   Tobacco comments:    smoked in college  Vaping Use   Vaping Use: Never used  Substance Use Topics   Alcohol use: Yes    Comment: may drink 1x/month   Drug use: Never     Allergies   Patient has no known allergies.   Review of Systems Review of Systems  Constitutional:  Negative for activity change, appetite change and fever.  HENT:  Positive for congestion and rhinorrhea. Negative for ear pain and sore throat.   Respiratory:  Positive for cough. Negative for shortness of breath and wheezing.   Gastrointestinal:  Negative for diarrhea, nausea and vomiting.  Skin:  Negative for rash.    Physical Exam Triage Vital Signs ED Triage Vitals  Enc Vitals Group     BP 11/10/20 1147 (!) 174/97     Pulse Rate 11/10/20 1147 72     Resp 11/10/20 1147 14     Temp 11/10/20 1147 98.7 F (37.1 C)     Temp Source 11/10/20 1147 Oral     SpO2 11/10/20 1147 98 %     Weight 11/10/20 1145 194 lb (88 kg)     Height 11/10/20 1145 5\' 4"  (1.626 m)     Head Circumference --      Peak Flow --      Pain Score 11/10/20 1145 0     Pain Loc --      Pain Edu? --      Excl. in [place]? --    No data found.  Updated Vital Signs BP (!) 174/97 (BP Location: Left Arm)   Pulse 72   Temp 98.7 F (37.1 C) (Oral)   Resp 14   Ht 5\' 4"  (1.626 m)   Wt 194 lb (88 kg)   SpO2 98%   BMI 33.30 kg/m   Visual Acuity Right Eye Distance:   Left Eye Distance:   Bilateral Distance:    Right Eye Near:   Left Eye Near:     Bilateral Near:     Physical Exam Vitals and nursing note reviewed.  Constitutional:      Appearance: Normal appearance. She is not ill-appearing.  HENT:     Head: Normocephalic and atraumatic.  Right Ear: Tympanic membrane, ear canal and external ear normal. There is no impacted cerumen.     Left Ear: Tympanic membrane, ear canal and external ear normal. There is no impacted cerumen.     Nose: Congestion and rhinorrhea present.     Mouth/Throat:     Mouth: Mucous membranes are moist.     Pharynx: Oropharynx is clear. No posterior oropharyngeal erythema.  Cardiovascular:     Rate and Rhythm: Normal rate and regular rhythm.     Pulses: Normal pulses.     Heart sounds: Normal heart sounds. No murmur heard.   No gallop.  Pulmonary:     Effort: Pulmonary effort is normal.     Breath sounds: Normal breath sounds. No wheezing, rhonchi or rales.  Musculoskeletal:     Cervical back: Normal range of motion and neck supple.  Lymphadenopathy:     Cervical: No cervical adenopathy.  Skin:    General: Skin is warm and dry.     Capillary Refill: Capillary refill takes less than 2 seconds.     Findings: No erythema or rash.  Neurological:     General: No focal deficit present.     Mental Status: She is alert and oriented to person, place, and time.  Psychiatric:        Mood and Affect: Mood normal.        Behavior: Behavior normal.        Thought Content: Thought content normal.        Judgment: Judgment normal.     UC Treatments / Results  Labs (all labs ordered are listed, but only abnormal results are displayed) Labs Reviewed  SARS CORONAVIRUS 2 (TAT 6-24 HRS)    EKG   Radiology No results found.  Procedures Procedures (including critical care time)  Medications Ordered in UC Medications - No data to display  Initial Impression / Assessment and Plan / UC Course  I have reviewed the triage vital signs and the nursing notes.  Pertinent labs & imaging results that  were available during my care of the patient were reviewed by me and considered in my medical decision making (see chart for details).  Patient is a very pleasant and nontoxic-appearing 87 year old female here for evaluation of respiratory complaints as outlined in the HPI above.  Patient's physical exam reveals pearly gray tympanic membranes bilaterally with normal light reflex and clear external auditory canals.  Nasal mucosa is erythematous and edematous with scant clear nasal discharge.  Oropharyngeal exam is benign.  No cervical lymphadenopathy appreciated exam.  Cardiopulmonary exam is benign.  Will swab patient for COVID and discharged home to isolate pending the results.  Will give Atrovent nasal spray and Tessalon Perles to help with cough and congestion.  Patient is positive for COVID will treat with molnupiravir.   Final Clinical Impressions(s) / UC Diagnoses   Final diagnoses:  Viral URI with cough     Discharge Instructions      Isolate at home pending the results of your COVID test.  If you test positive then you will have to quarantine for 5 days from the start of your symptoms.  After 5 days you can break quarantine if your symptoms have improved and you have not had a fever for 24 hours without taking Tylenol or ibuprofen.  Use over-the-counter Tylenol and ibuprofen as needed for body aches and fever.  Use the Tessalon Perles during the day as needed for cough.  Use the Atrovent nasal spray, 2 squirts  each nostril every 6 hours, as needed for nasal congestion.  If you develop any increased shortness of breath-especially at rest, you are unable to speak in full sentences, or is a late sign your lips are turning blue you need to go the ER for evaluation.      ED Prescriptions     Medication Sig Dispense Auth. Provider   benzonatate (TESSALON) 100 MG capsule Take 2 capsules (200 mg total) by mouth every 8 (eight) hours. 21 capsule Margarette Canada, NP   ipratropium  (ATROVENT) 0.06 % nasal spray Place 2 sprays into both nostrils 4 (four) times daily. 15 mL Margarette Canada, NP      PDMP not reviewed this encounter.   Margarette Canada, NP 11/10/20 1223

## 2020-11-10 NOTE — ED Triage Notes (Signed)
Patient c/o cough, congestion, and runny nose that started couple days ago.  Patient denies fevers. Patient wants a covid test.

## 2020-11-11 LAB — SARS CORONAVIRUS 2 (TAT 6-24 HRS): SARS Coronavirus 2: POSITIVE — AB

## 2020-11-12 ENCOUNTER — Telehealth: Payer: Self-pay | Admitting: Emergency Medicine

## 2020-11-12 MED ORDER — MOLNUPIRAVIR EUA 200MG CAPSULE: 4.0000 | ORAL_CAPSULE | Freq: Two times a day (BID) | ORAL | 0 refills | Status: AC

## 2020-11-12 NOTE — Telephone Encounter (Signed)
Patient is positive for COVID-19 and she has significant risk factors so I am prescribing antiviral therapy.

## 2020-11-20 DIAGNOSIS — U071 COVID-19: Secondary | ICD-10-CM

## 2020-11-20 HISTORY — DX: COVID-19: U07.1

## 2021-05-09 IMAGING — CR DG HIP (WITH OR WITHOUT PELVIS) 2-3V*L*
4 series · 5 of 5 positions shown · non-contrast
Comparison: None.

CLINICAL DATA: Fall

EXAM:
DG HIP (WITH OR WITHOUT PELVIS) 2-3V LEFT

[pelvis ap]
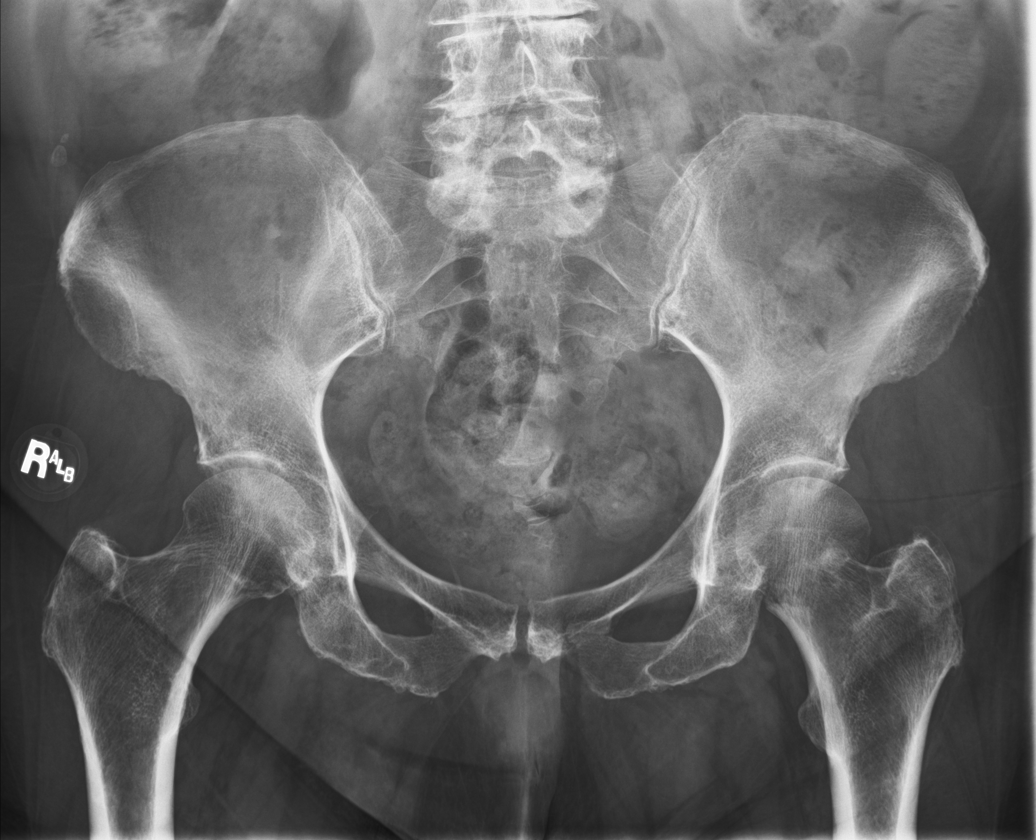

[Series 2: hip ap · 0.14mm/px · 2 of 2 slices shown]
[im 1/2]
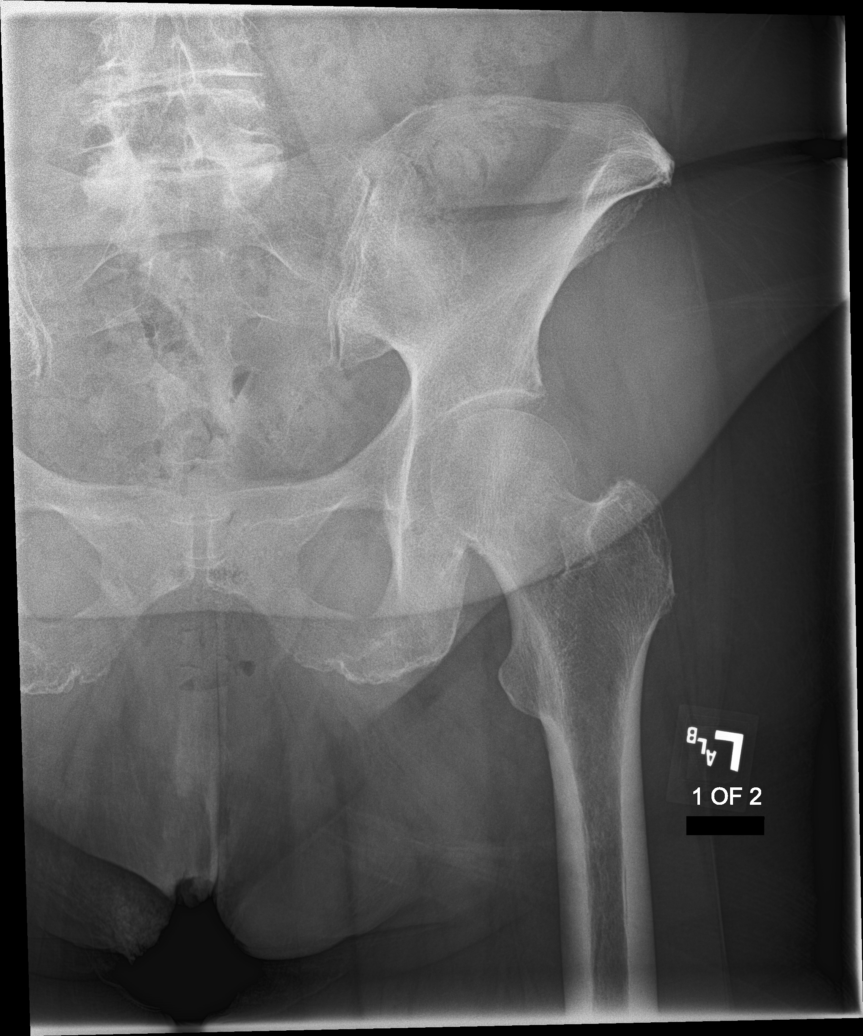
[im 2/2]
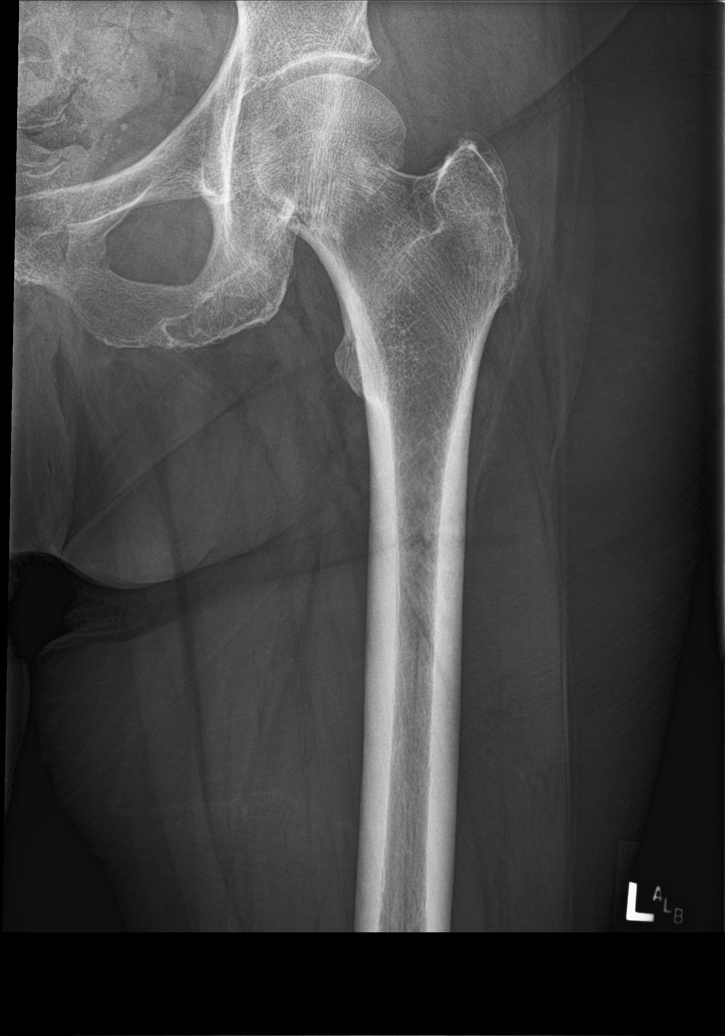

[hip lat (1 of 2)]
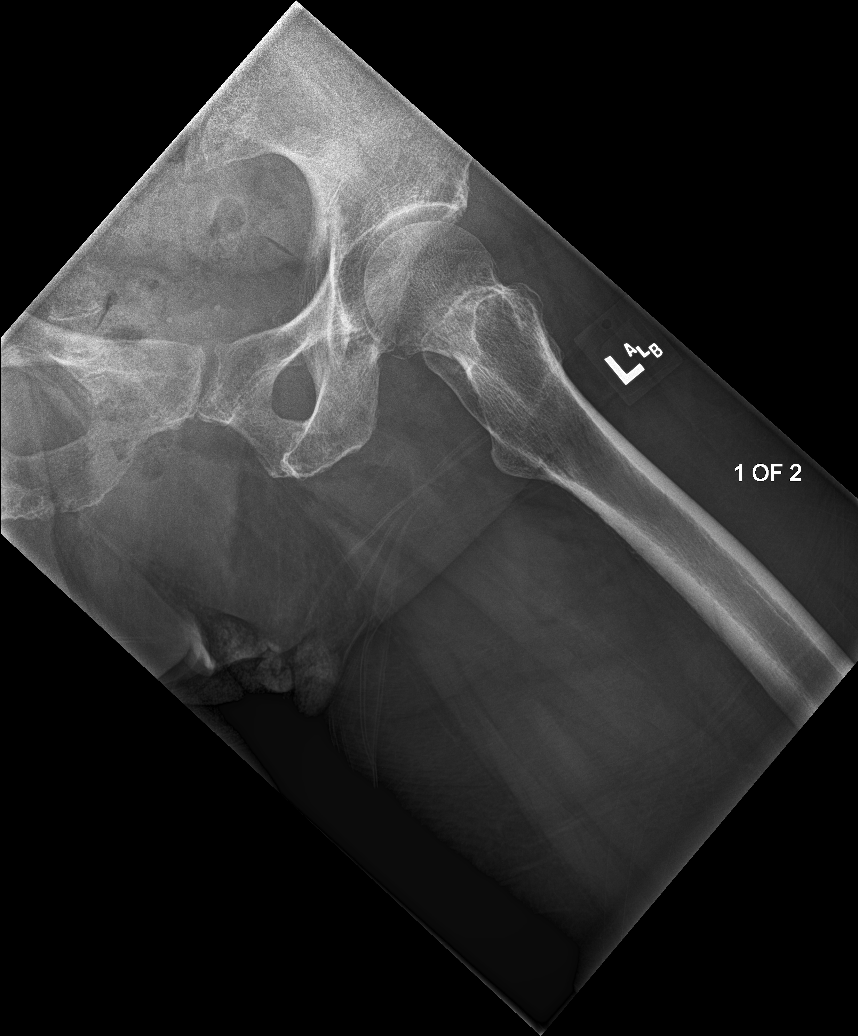

[hip lat (2 of 2)]
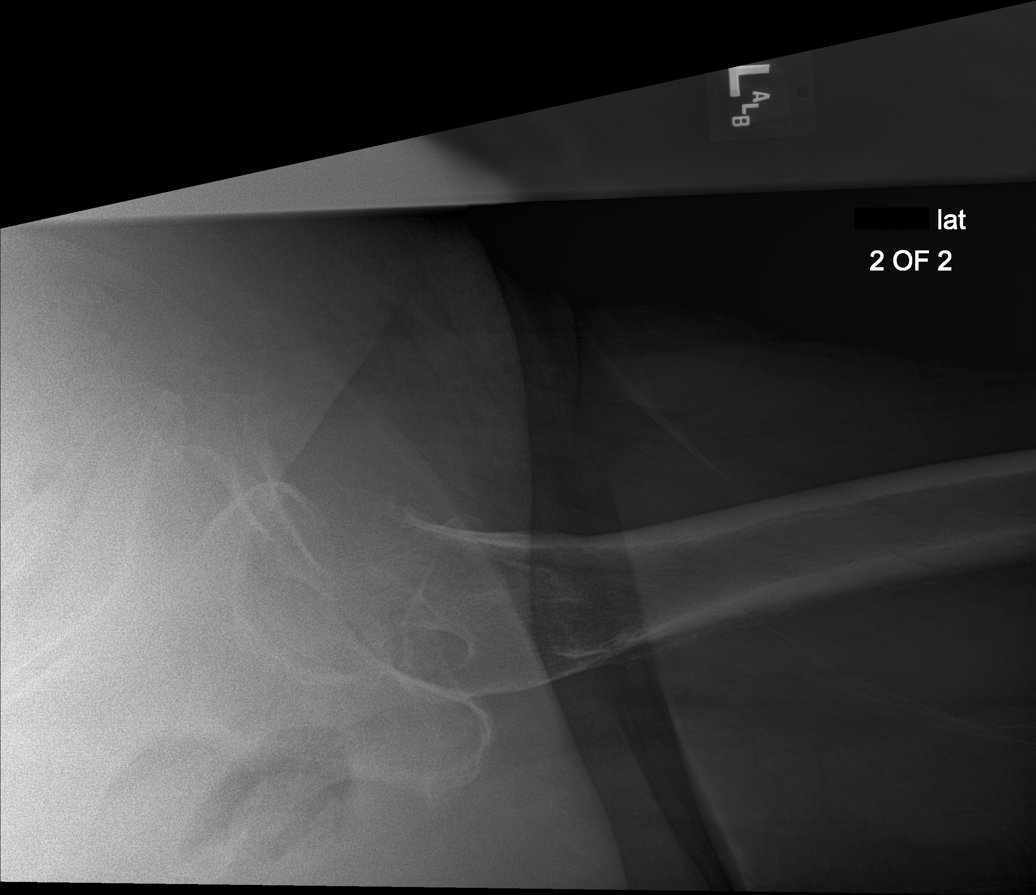

[5 of 5 positions shown; findings below may reference images not displayed]

FINDINGS: Osteopenia. There is an angulated and impacted subcapital fracture
of the left femoral neck. No other evidence of pelvic fracture or
fracture of the proximal right femur seen in frontal view only.
IMPRESSION: There is an angulated and impacted subcapital fracture of the left
femoral neck.

## 2021-05-11 IMAGING — RF DG HIP (WITH PELVIS) OPERATIVE*L*
1 series · 7 of 7 positions shown · non-contrast
Comparison: 04/16/2020

CLINICAL DATA: Hip fracture

EXAM:
OPERATIVE left HIP (WITH PELVIS IF PERFORMED) 7 VIEWS
TECHNIQUE: Fluoroscopic spot image(s) were submitted for interpretation
post-operatively.

[Series 1: run · 7 of 7 slices shown]
[im 1/7]
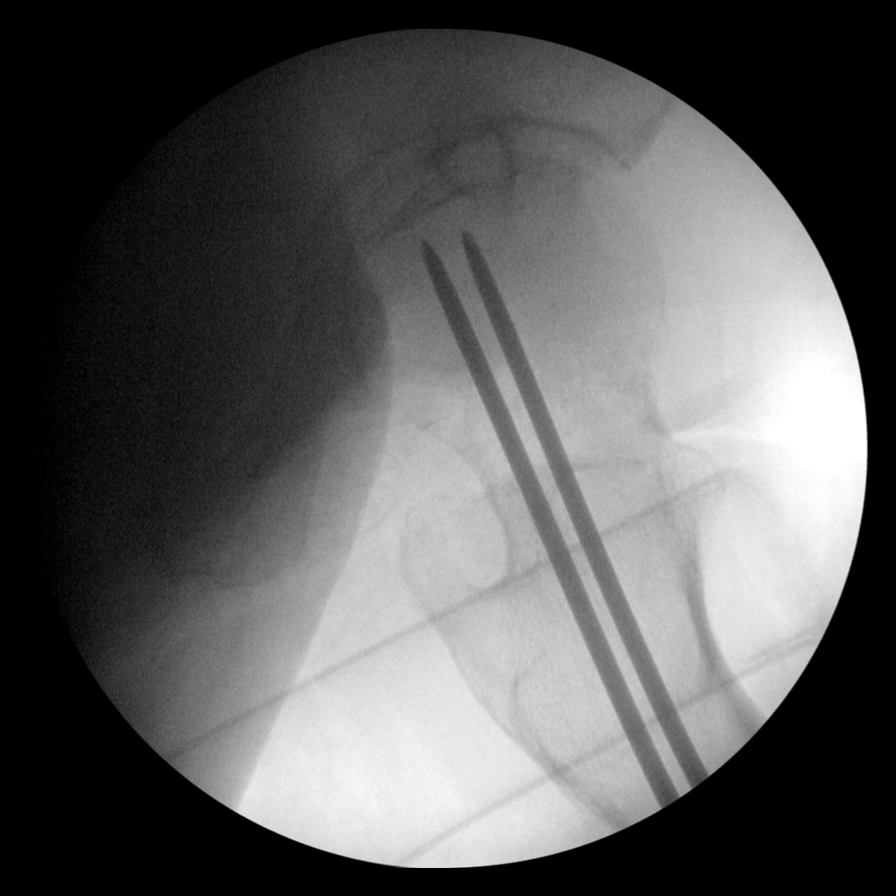
[im 2/7]
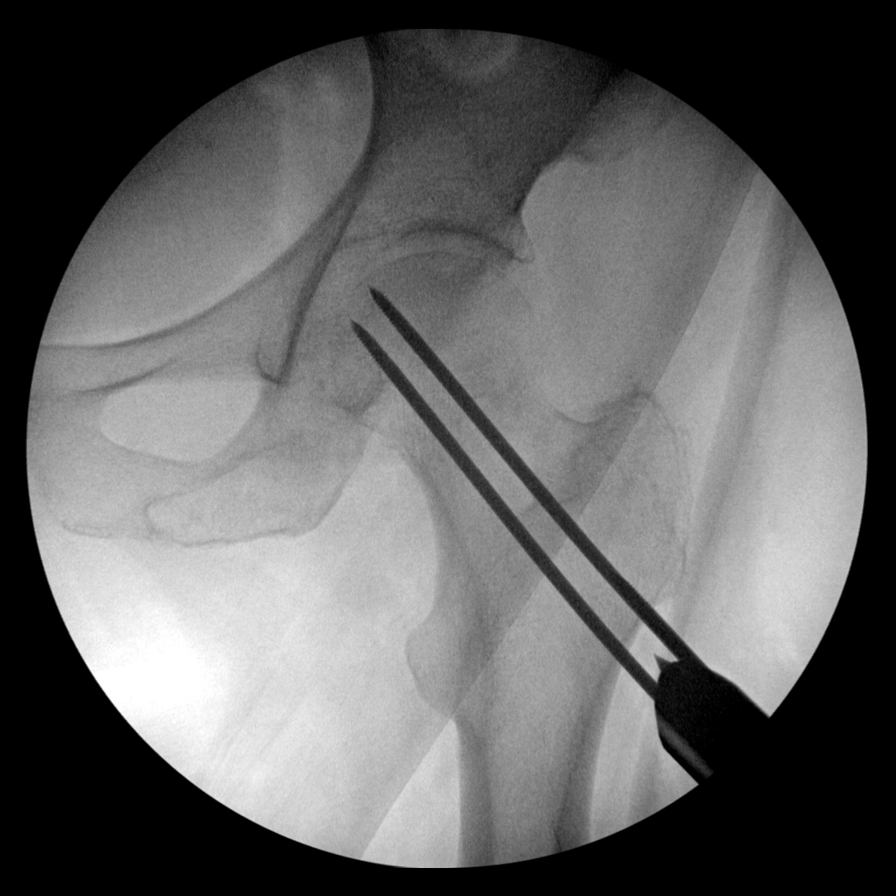
[im 3/7]
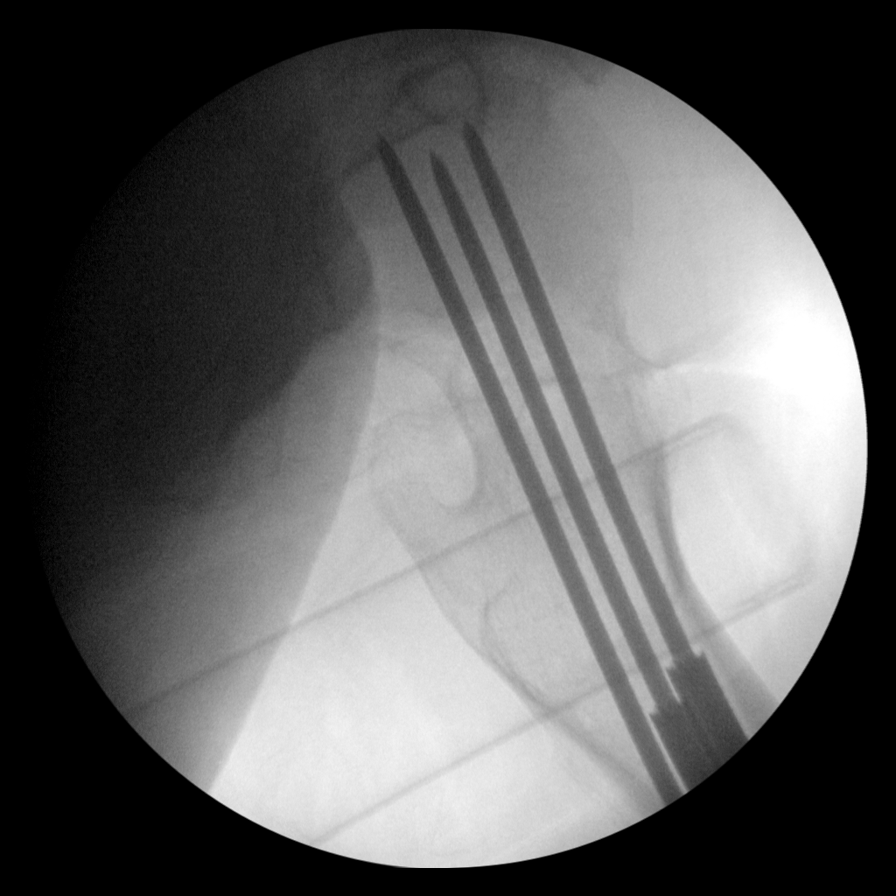
[im 4/7]
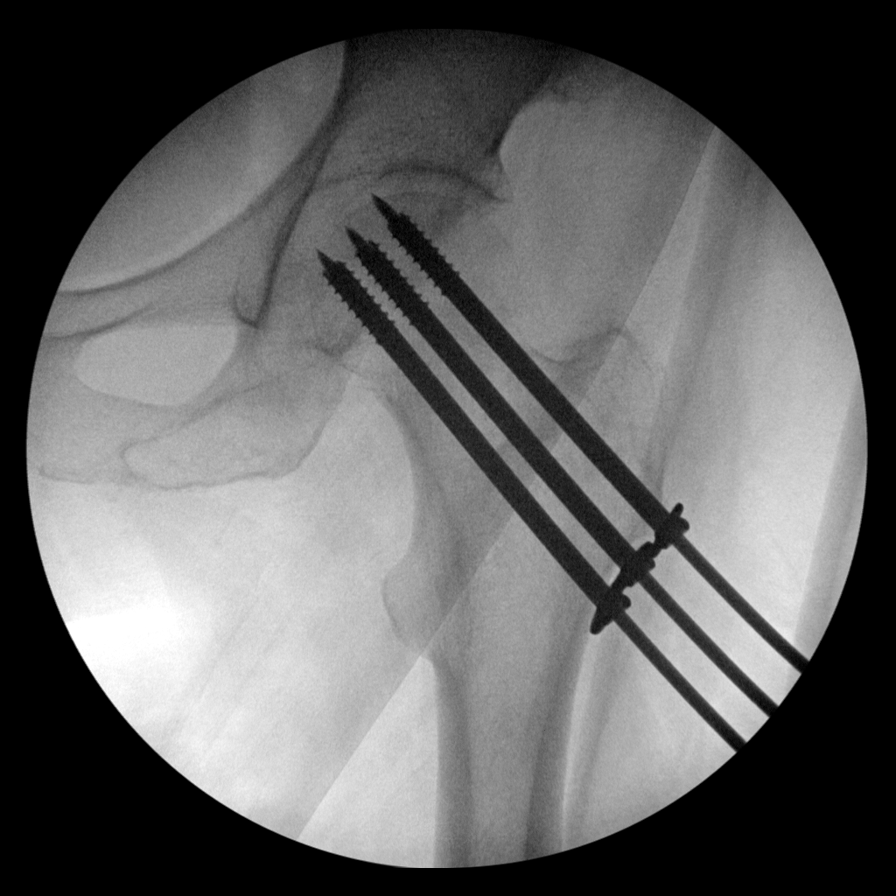
[im 5/7]
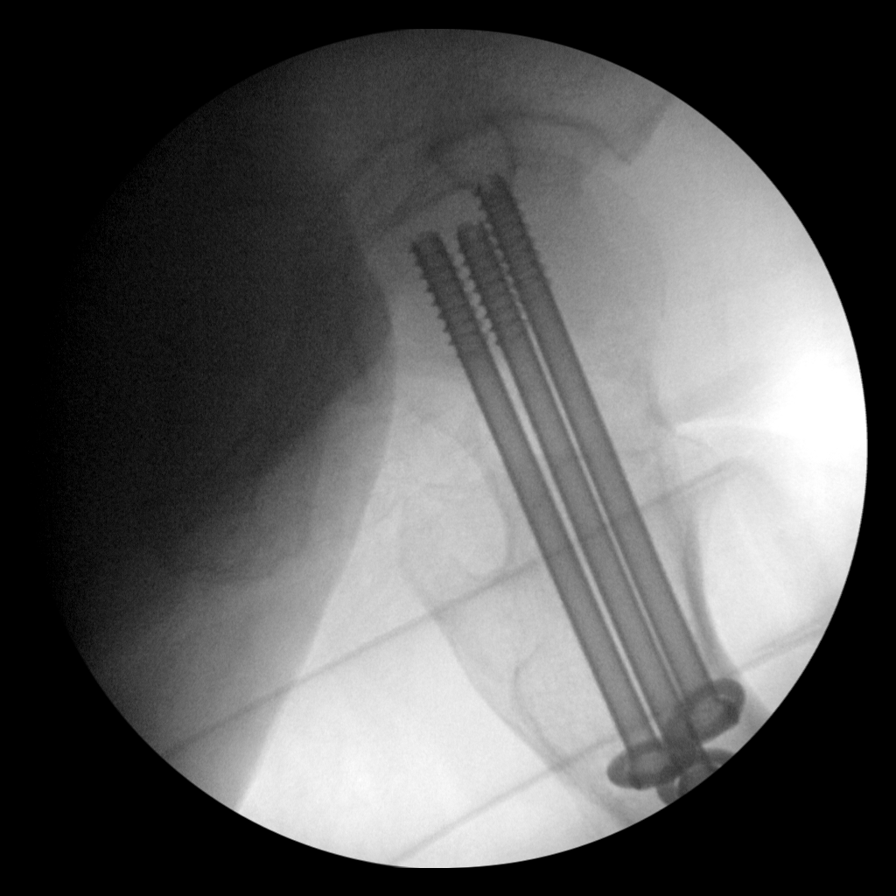
[im 6/7]
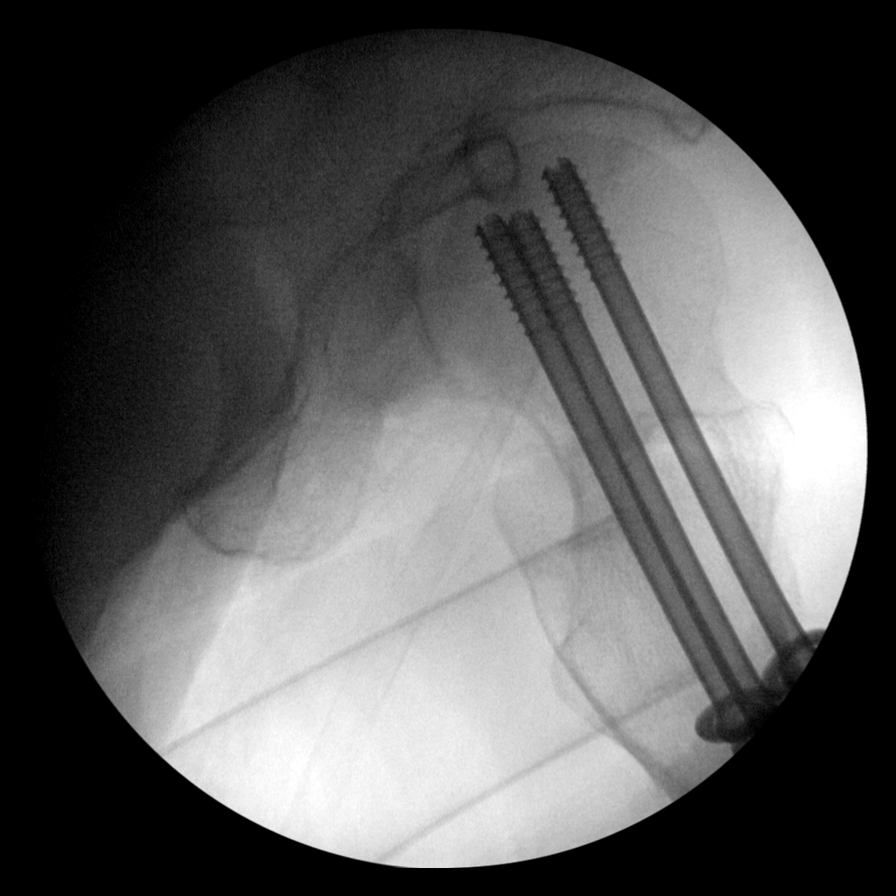
[im 7/7]
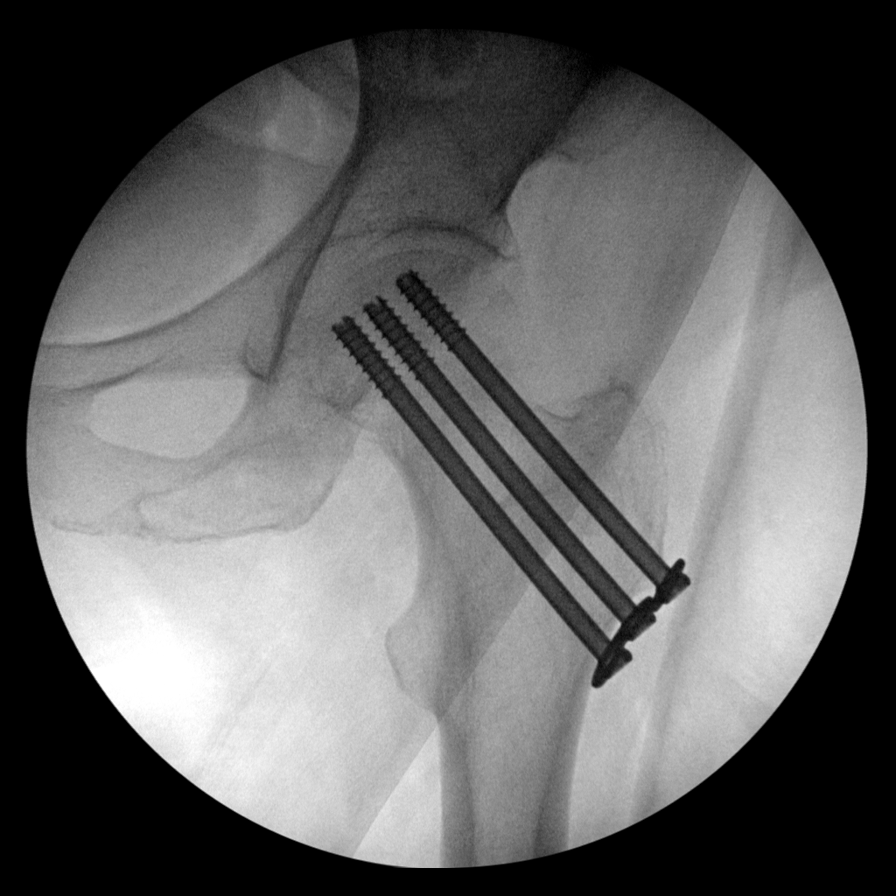

[7 of 7 positions shown; findings below may reference images not displayed]

FINDINGS: Seven low resolution intraoperative spot views of the left hip. The
images were obtained during internal fixation of left femoral neck
fracture. The images demonstrate 3 threaded screws fixating the left
femoral neck fracture. There is anatomic alignment.
IMPRESSION: Intraoperative fluoroscopic assistance provided during internal
fixation of left femoral neck fracture.

## 2021-06-04 ENCOUNTER — Other Ambulatory Visit: Payer: Self-pay | Admitting: Surgery

## 2021-06-17 ENCOUNTER — Other Ambulatory Visit
Admission: RE | Admit: 2021-06-17 | Discharge: 2021-06-17 | Disposition: A | Discharge status: Home / Self Care | Payer: Medicare Other | Source: Ambulatory Visit | Attending: Surgery | Admitting: Surgery

## 2021-06-17 ENCOUNTER — Other Ambulatory Visit: Payer: Self-pay

## 2021-06-17 NOTE — Patient Instructions (Addendum)
Your procedure is scheduled on: Tuesday June 25, 2021. Report to Day Surgery inside [facility] 2nd floor, stop by admissions desk before getting on elevator. To find out your arrival time please call [phone] between 1PM - 3PM on Monday June 24, 2021.  Remember: Instructions that are not followed completely may result in serious medical risk,  up to and including death, or upon the discretion of your surgeon and anesthesiologist your  surgery may need to be rescheduled.     _X__ 1. Do not eat food after midnight the night before your procedure.                 No chewing gum or hard candies. You may drink clear liquids up to 2 hours                 before you are scheduled to arrive for your surgery- DO not drink clear                 liquids within 2 hours of the start of your surgery.                 Clear Liquids include:  water, apple juice without pulp, clear Gatorade, G2 or                  Gatorade Zero (avoid Red/Purple/Blue), Black Coffee or Tea (Do not add                 anything to coffee or tea).  __X__2.   Complete the "Ensure Clear Pre-surgery Clear Carbohydrate Drink" provided to you, 2 hours before arrival. **If you are diabetic you will be provided with an alternative drink, Gatorade Zero or G2.  __X__3  On the morning of surgery brush your teeth with toothpaste and water, you                may rinse your mouth with mouthwash if you wish.  Do not swallow any toothpaste of mouthwash.     _X__ 4.  No Alcohol for 24 hours before or after surgery.   _X__ 5.  Do Not Smoke or use e-cigarettes For 24 Hours Prior to Your Surgery.                 Do not use any chewable tobacco products for at least 6 hours prior to                 Surgery.  _X__  6.  Do not use any recreational drugs (marijuana, cocaine, heroin, ecstasy, MDMA or other)                For at least one week prior to your surgery.  Combination of these drugs with anesthesia                 May have life threatening results.  ____  7.  Bring all medications with you on the day of surgery if instructed.   __X__8.  Notify your doctor if there is any change in your medical condition      (cold, fever, infections).     Do not wear jewelry, make-up, hairpins, clips or nail polish. Do not wear lotions, powders, or perfumes. You may wear deodorant. Do not shave 48 hours prior to surgery. Men may shave face and neck. Do not bring valuables to the hospital.    [facility] is not responsible for any belongings or valuables.  Contacts, dentures  or bridgework may not be worn into surgery. Leave your suitcase in the car. After surgery it may be brought to your room. For patients admitted to the hospital, discharge time is determined by your treatment team.   Patients discharged the day of surgery will not be allowed to drive home.   Make arrangements for someone to be with you for the first 24 hours of your Same Day Discharge.   __X__ Take these medicines the morning of surgery with A SIP OF WATER:    1. FLUoxetine (PROZAC) 30 MG (if needed)  2.   3.   4.  5.  6.  ____ Fleet Enema (as directed)   __X__ Use CHG Soap (or wipes) as directed  ____ Use Benzoyl Peroxide Gel as instructed  ____ Use inhalers on the day of surgery  ____ Stop metformin 2 days prior to surgery    ____ Take 1/2 of usual insulin dose the night before surgery. No insulin the morning          of surgery.   ____ Call your PCP, cardiologist, or Pulmonologist if taking Coumadin/Plavix/aspirin and ask when to stop before your surgery.   __X__ One Week prior to surgery- Stop Anti-inflammatories such as Ibuprofen, Aleve, Advil, Motrin, meloxicam (MOBIC), diclofenac, etodolac, ketorolac, Toradol, Daypro, piroxicam, Goody's or BC powders. OK TO USE TYLENOL IF NEEDED   __X__ Stop supplements until after surgery.    ____ Bring C-Pap to the hospital.    If you have any questions regarding  your pre-procedure instructions,  Please call Pre-admit Testing at [phone]

## 2021-06-20 ENCOUNTER — Other Ambulatory Visit: Payer: Self-pay

## 2021-06-20 ENCOUNTER — Other Ambulatory Visit
Admission: RE | Admit: 2021-06-20 | Discharge: 2021-06-20 | Disposition: A | Discharge status: Home / Self Care | Payer: Medicare Other | Source: Ambulatory Visit | Attending: Surgery | Admitting: Surgery

## 2021-06-20 DIAGNOSIS — Z01810 Encounter for preprocedural cardiovascular examination: Secondary | ICD-10-CM | POA: Insufficient documentation

## 2021-06-20 DIAGNOSIS — I10 Essential (primary) hypertension: Secondary | ICD-10-CM | POA: Diagnosis not present

## 2021-06-25 ENCOUNTER — Ambulatory Visit: Payer: Medicare Other | Admitting: Anesthesiology

## 2021-06-25 ENCOUNTER — Ambulatory Visit
Admission: RE | Admit: 2021-06-25 | Discharge: 2021-06-25 | Disposition: A | Discharge status: Home / Self Care | Payer: Medicare Other | Source: Ambulatory Visit | Attending: Surgery | Admitting: Surgery

## 2021-06-25 ENCOUNTER — Encounter
Admission: RE | Disposition: A | Discharge status: Home / Self Care | Payer: Self-pay | Source: Ambulatory Visit | Attending: Surgery

## 2021-06-25 ENCOUNTER — Encounter: Payer: Self-pay | Admitting: Surgery

## 2021-06-25 ENCOUNTER — Other Ambulatory Visit: Payer: Self-pay

## 2021-06-25 ENCOUNTER — Ambulatory Visit: Payer: Medicare Other

## 2021-06-25 DIAGNOSIS — Z419 Encounter for procedure for purposes other than remedying health state, unspecified: Secondary | ICD-10-CM

## 2021-06-25 DIAGNOSIS — Y831 Surgical operation with implant of artificial internal device as the cause of abnormal reaction of the patient, or of later complication, without mention of misadventure at the time of the procedure: Secondary | ICD-10-CM | POA: Diagnosis not present

## 2021-06-25 DIAGNOSIS — N319 Neuromuscular dysfunction of bladder, unspecified: Secondary | ICD-10-CM | POA: Insufficient documentation

## 2021-06-25 DIAGNOSIS — I10 Essential (primary) hypertension: Secondary | ICD-10-CM | POA: Diagnosis not present

## 2021-06-25 DIAGNOSIS — N39498 Other specified urinary incontinence: Secondary | ICD-10-CM | POA: Insufficient documentation

## 2021-06-25 DIAGNOSIS — F32A Depression, unspecified: Secondary | ICD-10-CM | POA: Insufficient documentation

## 2021-06-25 DIAGNOSIS — T8484XA Pain due to internal orthopedic prosthetic devices, implants and grafts, initial encounter: Secondary | ICD-10-CM | POA: Insufficient documentation

## 2021-06-25 DIAGNOSIS — Z87891 Personal history of nicotine dependence: Secondary | ICD-10-CM | POA: Insufficient documentation

## 2021-06-25 HISTORY — PX: HARDWARE REMOVAL: SHX979

## 2021-06-25 SURGERY — REMOVAL, HARDWARE
Anesthesia: General | Site: Hip | Laterality: Left

## 2021-06-25 MED ORDER — CEFAZOLIN SODIUM-DEXTROSE 2-4 GM/100ML-% IV SOLN
2.0000 g | INTRAVENOUS | Status: AC
  Administered 2021-06-25: 2 g via INTRAVENOUS

## 2021-06-25 MED ORDER — ACETAMINOPHEN 10 MG/ML IV SOLN
INTRAVENOUS | Status: AC
  Filled 2021-06-25: qty 100

## 2021-06-25 MED ORDER — PHENYLEPHRINE HCL (PRESSORS) 10 MG/ML IV SOLN
INTRAVENOUS | Status: DC | PRN
  Administered 2021-06-25 (×4): 80 ug via INTRAVENOUS

## 2021-06-25 MED ORDER — PROPOFOL 10 MG/ML IV BOLUS
INTRAVENOUS | Status: DC | PRN
  Administered 2021-06-25: 20 mg via INTRAVENOUS
  Administered 2021-06-25: 120 mg via INTRAVENOUS

## 2021-06-25 MED ORDER — CEFAZOLIN SODIUM-DEXTROSE 2-4 GM/100ML-% IV SOLN
INTRAVENOUS | Status: AC
  Filled 2021-06-25: qty 100

## 2021-06-25 MED ORDER — PHENYLEPHRINE 40 MCG/ML (10ML) SYRINGE FOR IV PUSH (FOR BLOOD PRESSURE SUPPORT): PREFILLED_SYRINGE | INTRAVENOUS | Status: DC | PRN

## 2021-06-25 MED ORDER — LACTATED RINGERS IV SOLN: INTRAVENOUS | Status: DC

## 2021-06-25 MED ORDER — FENTANYL CITRATE (PF) 100 MCG/2ML IJ SOLN
INTRAMUSCULAR | Status: DC | PRN
  Administered 2021-06-25: 50 ug via INTRAVENOUS
  Administered 2021-06-25 (×2): 25 ug via INTRAVENOUS

## 2021-06-25 MED ORDER — BUPIVACAINE-EPINEPHRINE (PF) 0.5% -1:200000 IJ SOLN
INTRAMUSCULAR | Status: AC
  Filled 2021-06-25: qty 30

## 2021-06-25 MED ORDER — OXYCODONE HCL 5 MG PO TABS
5.0000 mg | ORAL_TABLET | Freq: Once | ORAL | Status: AC
  Administered 2021-06-25: 5 mg via ORAL

## 2021-06-25 MED ORDER — ONDANSETRON HCL 4 MG/2ML IJ SOLN: 4.0000 mg | Freq: Once | INTRAMUSCULAR | Status: DC | PRN

## 2021-06-25 MED ORDER — ONDANSETRON HCL 4 MG/2ML IJ SOLN
INTRAMUSCULAR | Status: DC | PRN
  Administered 2021-06-25: 4 mg via INTRAVENOUS

## 2021-06-25 MED ORDER — FENTANYL CITRATE (PF) 100 MCG/2ML IJ SOLN: 25.0000 ug | INTRAMUSCULAR | Status: DC | PRN

## 2021-06-25 MED ORDER — FENTANYL CITRATE (PF) 100 MCG/2ML IJ SOLN
INTRAMUSCULAR | Status: AC
  Filled 2021-06-25: qty 2

## 2021-06-25 MED ORDER — HYDROCODONE-ACETAMINOPHEN 5-325 MG PO TABS: 1.0000 | ORAL_TABLET | Freq: Four times a day (QID) | ORAL | 0 refills | Status: DC | PRN

## 2021-06-25 MED ORDER — EPHEDRINE SULFATE (PRESSORS) 50 MG/ML IJ SOLN
INTRAMUSCULAR | Status: DC | PRN
  Administered 2021-06-25: 10 mg via INTRAVENOUS
  Administered 2021-06-25: 5 mg via INTRAVENOUS
  Administered 2021-06-25: 10 mg via INTRAVENOUS

## 2021-06-25 MED ORDER — BUPIVACAINE-EPINEPHRINE 0.5% -1:200000 IJ SOLN
INTRAMUSCULAR | Status: DC | PRN
  Administered 2021-06-25: 20 mL

## 2021-06-25 MED ORDER — ORAL CARE MOUTH RINSE: 15.0000 mL | Freq: Once | OROMUCOSAL | Status: AC

## 2021-06-25 MED ORDER — CHLORHEXIDINE GLUCONATE 0.12 % MT SOLN
OROMUCOSAL | Status: AC
  Administered 2021-06-25: 15 mL via OROMUCOSAL
  Filled 2021-06-25: qty 15

## 2021-06-25 MED ORDER — FAMOTIDINE 20 MG PO TABS
ORAL_TABLET | ORAL | Status: AC
  Administered 2021-06-25: 20 mg via ORAL
  Filled 2021-06-25: qty 1

## 2021-06-25 MED ORDER — LOSARTAN POTASSIUM 25 MG PO TABS
25.0000 mg | ORAL_TABLET | Freq: Once | ORAL | Status: AC
  Administered 2021-06-25: 25 mg via ORAL
  Filled 2021-06-25: qty 1

## 2021-06-25 MED ORDER — ACETAMINOPHEN 10 MG/ML IV SOLN
INTRAVENOUS | Status: DC | PRN
  Administered 2021-06-25: 1000 mg via INTRAVENOUS

## 2021-06-25 MED ORDER — BUPIVACAINE HCL (PF) 0.5 % IJ SOLN
INTRAMUSCULAR | Status: AC
  Filled 2021-06-25: qty 30

## 2021-06-25 MED ORDER — 0.9 % SODIUM CHLORIDE (POUR BTL) OPTIME
TOPICAL | Status: DC | PRN
Start: 2021-06-25 — End: 2021-06-25
  Administered 2021-06-25: 500 mL

## 2021-06-25 MED ORDER — FAMOTIDINE 20 MG PO TABS: 20.0000 mg | ORAL_TABLET | Freq: Once | ORAL | Status: AC

## 2021-06-25 MED ORDER — OXYCODONE HCL 5 MG PO TABS
ORAL_TABLET | ORAL | Status: AC
  Filled 2021-06-25: qty 1

## 2021-06-25 MED ORDER — DEXAMETHASONE SODIUM PHOSPHATE 10 MG/ML IJ SOLN
INTRAMUSCULAR | Status: DC | PRN
Start: 2021-06-25 — End: 2021-06-25
  Administered 2021-06-25: 8 mg via INTRAVENOUS

## 2021-06-25 MED ORDER — CHLORHEXIDINE GLUCONATE 0.12 % MT SOLN: 15.0000 mL | Freq: Once | OROMUCOSAL | Status: AC

## 2021-06-25 MED ORDER — BUPIVACAINE HCL (PF) 0.5 % IJ SOLN
INTRAMUSCULAR | Status: DC | PRN
Start: 2021-06-25 — End: 2021-06-25

## 2021-06-25 SURGICAL SUPPLY — 41 items
APL PRP STRL LF DISP 70% ISPRP (MISCELLANEOUS) ×2
BLADE SURG SZ10 CARB STEEL (BLADE) ×2 IMPLANT
BNDG COHESIVE 4X5 TAN ST LF (GAUZE/BANDAGES/DRESSINGS) ×2 IMPLANT
BNDG ELASTIC 4X5.8 VLCR STR LF (GAUZE/BANDAGES/DRESSINGS) ×2 IMPLANT
BNDG ESMARK 6X12 TAN STRL LF (GAUZE/BANDAGES/DRESSINGS) ×2 IMPLANT
CHLORAPREP W/TINT 26 (MISCELLANEOUS) ×4 IMPLANT
CUFF TOURN SGL QUICK 18X4 (TOURNIQUET CUFF) IMPLANT
CUFF TOURN SGL QUICK 24 (TOURNIQUET CUFF)
CUFF TRNQT CYL 24X4X16.5-23 (TOURNIQUET CUFF) IMPLANT
DRAPE FLUOR MINI C-ARM 54X84 (DRAPES) ×2 IMPLANT
DRAPE INCISE IOBAN 66X45 STRL (DRAPES) ×2 IMPLANT
DRAPE STERI IOBAN 125X83 (DRAPES) ×1 IMPLANT
DRAPE U-SHAPE 47X51 STRL (DRAPES) ×2 IMPLANT
ELECT CAUTERY BLADE 6.4 (BLADE) ×2 IMPLANT
ELECT REM PT RETURN 9FT ADLT (ELECTROSURGICAL) ×2
ELECTRODE REM PT RTRN 9FT ADLT (ELECTROSURGICAL) ×1 IMPLANT
GAUZE SPONGE 4X4 12PLY STRL (GAUZE/BANDAGES/DRESSINGS) ×2 IMPLANT
GAUZE XEROFORM 1X8 LF (GAUZE/BANDAGES/DRESSINGS) ×2 IMPLANT
GLOVE SURG ENC MOIS LTX SZ8 (GLOVE) ×4 IMPLANT
GLOVE SURG UNDER LTX SZ8 (GLOVE) ×2 IMPLANT
GOWN STRL REUS W/ TWL LRG LVL3 (GOWN DISPOSABLE) ×2 IMPLANT
GOWN STRL REUS W/ TWL XL LVL3 (GOWN DISPOSABLE) ×1 IMPLANT
GOWN STRL REUS W/TWL LRG LVL3 (GOWN DISPOSABLE) ×4
GOWN STRL REUS W/TWL XL LVL3 (GOWN DISPOSABLE) ×2
GUIDEWIRE THRD ASNIS 3.2X300 (WIRE) ×1 IMPLANT
KIT TURNOVER KIT A (KITS) ×2 IMPLANT
MANIFOLD NEPTUNE II (INSTRUMENTS) ×2 IMPLANT
NDL FILTER BLUNT 18X1 1/2 (NEEDLE) ×1 IMPLANT
NEEDLE FILTER BLUNT 18X 1/2SAF (NEEDLE) ×1
NEEDLE FILTER BLUNT 18X1 1/2 (NEEDLE) ×1 IMPLANT
NS IRRIG 1000ML POUR BTL (IV SOLUTION) ×2 IMPLANT
PACK EXTREMITY ARMC (MISCELLANEOUS) ×2 IMPLANT
STAPLER SKIN PROX 35W (STAPLE) ×2 IMPLANT
STOCKINETTE M/LG 89821 (MISCELLANEOUS) ×2 IMPLANT
SUT PROLENE 4 0 PS 2 18 (SUTURE) ×2 IMPLANT
SUT VIC AB 2-0 CT1 27 (SUTURE) ×2
SUT VIC AB 2-0 CT1 TAPERPNT 27 (SUTURE) IMPLANT
SUT VIC AB 2-0 SH 27 (SUTURE)
SUT VIC AB 2-0 SH 27XBRD (SUTURE) ×2 IMPLANT
SYR 10ML LL (SYRINGE) ×2 IMPLANT
WATER STERILE IRR 500ML POUR (IV SOLUTION) ×2 IMPLANT

## 2021-06-25 NOTE — Op Note (Signed)
06/25/2021  2:18 PM  Patient:   Cynthia Ritter  Pre-Op Diagnosis:   Painful retained hardware status post in situ cannulated screw fixation of left femoral neck fracture.  Post-Op Diagnosis:   Same  Procedure:   Removal of painful retained screws, left hip.  Surgeon:   Pascal Lux, MD  Assistant:   Kerney Elbe, PA-S  Anesthesia:   General LMA  Findings:   As above.  Complications:   None  Fluids:   500 cc crystalloid  EBL:   20 cc  UOP:   None  TT:   None  Drains:   None  Closure:   Staples  Brief Clinical Note:   The patient is an 88 year old female who is now 14 months status post an in situ cannulated screw fixation of a valgus impacted left femoral neck fracture.  The patient has gone on to heal the fracture well, but continues no pain in the lateral aspect of her left hip.  X-rays demonstrate some prominence of the screws laterally, as well as some early degenerative changes of the left hip.  The patient has declined conversion to a total hip arthroplasty, but would like to have her screws removed.  She presents at this time for hardware removal from her left hip.  Procedure:   The patient was brought into the operating room and lain in the supine position.  After adequate general laryngeal mask anesthesia was obtained, the patient was repositioned on the fracture table so that her right leg was flexed and abducted out of the way and the left leg was placed in gentle longitudinal traction.  The lateral aspect of the left hip and thigh were prepped with ChloraPrep solution before being draped sterilely.  Preoperative antibiotics were administered.  A timeout was performed to verify the appropriate surgical site.  Incorporating the previous incision, an approximately proximately 4-5 cm incision was made over the lateral aspect of the left thigh.  The incision was carried down through the subcutaneous tissues to expose the IT band.  This is split the length of the incision  and the vastus lateralis fibers divided longitudinally to expose the screw heads.  The position of each of the heads was verified fluoroscopically before a guidewire was inserted to help guide the cannulated drill.  Each screw was removed sequentially along with its respective washer.  The adequacy of hardware removal was verified fluoroscopically in AP and lateral projections and found to be excellent.  The wound was copiously irrigated with sterile saline solution before the IT band was closed using #0 Vicryl interrupted sutures.  The subcutaneous tissues were closed in 2 layers using 2-0 Vicryl interrupted sutures before the skin was closed using staples.  A total of 20 cc of 0.5% Sensorcaine with epinephrine was injected in and around the incision to help with postoperative analgesia before a sterile occlusive dressing was applied to the wound.  The patient was then awakened, extubated, and returned to the recovery room in satisfactory condition after tolerating the procedure well.

## 2021-06-25 NOTE — Anesthesia Procedure Notes (Signed)
Procedure Name: LMA Insertion Date/Time: 06/25/2021 1:10 PM Performed by: Lerry Liner, CRNA Pre-anesthesia Checklist: Patient identified, Emergency Drugs available, Suction available and Patient being monitored Patient Re-evaluated:Patient Re-evaluated prior to induction Oxygen Delivery Method: Circle system utilized Preoxygenation: Pre-oxygenation with 100% oxygen Induction Type: IV induction Ventilation: Mask ventilation without difficulty LMA: LMA inserted LMA Size: 3.0 Tube type: Oral Number of attempts: 1 Airway Equipment and Method: Stylet and Oral airway Placement Confirmation: ETT inserted through vocal cords under direct vision, positive ETCO2 and breath sounds checked- equal and bilateral Tube secured with: Tape Dental Injury: Teeth and Oropharynx as per pre-operative assessment

## 2021-06-25 NOTE — H&P (Signed)
History of Present Illness:  Cynthia Ritter is a 88 y.o. female who presents for evaluation and treatment of her left hip pain. The patient is now 13 months status post a percutaneous pinning of a left femoral neck fracture by Dr. Posey Pronto. The patient did well for the first 6 months or so but then began to experience increased pain in the lateral aspect of her left hip. X-rays in August, 2022, suggested prominent screw heads contributing to trochanteric bursitis. She underwent an ultrasound-guided aspiration/injection of the left greater trochanteric region by Dr. Candelaria Stagers which provided about 3 weeks worth of relief. However, she also began to develop left groin pain. Repeat x-rays in October, 2022, now demonstrated progressive degenerative changes of the left hip joint with a possible element of avascular necrosis developing, per Dr. Serita Grit report. Because of these increased symptoms, the patient has been referred to me for further evaluation and treatment.  On today's visit, the patient complains of 3/10 pain. She localizes the pain toward the groin region, as well as over the lateral aspect of her left hip. She also has anterior and medial-sided left knee pain, but feels that that is coming from her knee rather than from her hip. She has been taking Tylenol, gabapentin, and occasional hydrocodone as necessary with relief. She also has been applying ice or ice alternating with heat to the hip area which she finds to be helpful as well. She has been using a cane for ambulation. She has difficulty reciprocating stairs and also has pain at night. She denies any recent reinjury to the hip, and denies any numbness or paresthesias down her leg to her foot.  Current Outpatient Medications:  acetaminophen (TYLENOL) 650 MG ER tablet Take 650 mg by mouth every 8 (eight) hours as needed for Pain   alendronate (FOSAMAX) 70 MG tablet Take 1 tablet (70 mg total) by mouth every 7 (seven) days Take with a full glass of  water. Do not lie down for the next 30 min. 12 tablet 1   celecoxib (CELEBREX) 200 MG capsule Take 1 capsule (200 mg total) by mouth 2 (two) times daily 180 capsule 1   cyanocobalamin (VITAMIN B12) 1000 MCG tablet Take 1,000 mcg by mouth once daily   diclofenac (VOLTAREN) 1 % topical gel Apply topically 2 (two) times daily 300 g 1   FLUoxetine (PROZAC) 10 MG capsule Take 1 capsule (10 mg total) by mouth once daily 90 capsule 1   FLUoxetine (PROZAC) 20 MG capsule Take 1 capsule (20 mg total) by mouth once daily 90 capsule 1   losartan (COZAAR) 25 MG tablet Take 1 tablet (25 mg total) by mouth once daily 90 tablet 1   mirabegron (MYRBETRIQ) 25 mg ER Tablet Take 1 tablet (25 mg total) by mouth once daily for 180 days 90 tablet 1   tolterodine (DETROL LA) 4 MG LA capsule Take 1 capsule (4 mg total) by mouth once daily 90 capsule 1   Allergies: No Known Allergies  Past Medical History:   Allergic rhinitis   Arthritis   Bilateral hand numbness 03/08/2020   Carpal tunnel syndrome on both sides 02/10/2020   Cataract cortical, senile   Closed fracture of left hip 04/16/2020 (s/p pinning)   Colon polyps   COVID-19 11/10/2020   Depression   Essential hypertension 09/14/2017   Femoral neck fracture (CMS-HCC) 04/16/2020   Hyperlipidemia   Insomnia   Lipoma of left lower extremity 02/10/2020   Osteopenia 06/12/2020 (FRAX: 17% 10 year risk  of hip fracture. 30% 10 year risk of any major fracture)   Primary osteoarthritis of right knee 01/28/2017   Primary osteoarthritis of right knee 01/28/2017   Squamous cell cancer of multiple sites (Followed by Dr. Phillip Heal)   Trigger index finger of right hand 02/10/2020   Urinary incontinence, mixed   Past Surgical History:   CHOLECYSTECTOMY 1991   ENDOMETRIAL BIOPSY 2003 (Negative)   CATARACT EXTRACTION Bilateral 2017 (Dr. Wallace Going)   BIOPSY SKIN FACE 2018 (Squamous cell of nose)   EXTRACTION TEETH 12/2017   Cannulated Hip Pinning Left 04/18/2020    Endoscopic right carpal tunnel release Right 06/13/2020 (Dr.Donella Pascarella)   BIOPSY SKIN ARM (Multiple biopsies)   COLONOSCOPY x 2   Family History:   Liver disease Sister (Non-alcoholic)   Ulcers Sister   Heart failure Mother   Heart failure Father   Social History:   Socioeconomic History:   Marital status: Married  Spouse name: Reynolds Bowl"   Number of children: 2   Years of education: 67  Occupational History   Occupation: Retired Pharmacist, hospital  Tobacco Use   Smoking status: Former  Types: Cigarettes   Smokeless tobacco: Never   Tobacco comments:  smoked in [employer] Use: Never used  Substance and Sexual Activity   Alcohol use: Yes  Comment: very little. Wine or Liquor.   Drug use: No   Sexual activity: Not Currently  Partners: Male  Birth control/protection: Post-menopausal  Social History Narrative  Son, Legrand Como, is living w/ them now.   Review of Systems:  A comprehensive 14 point ROS was performed, reviewed, and the pertinent orthopaedic findings are documented in the HPI.  Physical Exam: Vitals:  05/29/21 0930  BP: 108/72  Weight: 85 kg (187 lb 6.4 oz)  Height: 160 cm (5\' 3" )  PainSc: 3  PainLoc: Hip   General/Constitutional: The patient appears to be well-nourished, well-developed, and in no acute distress. Neuro/Psych: Normal mood and affect, oriented to person, place and time. Eyes: Non-icteric. Pupils are equal, round, and reactive to light, and exhibit synchronous movement. ENT: Unremarkable. Lymphatic: No palpable adenopathy. Respiratory: Lungs clear to auscultation, Normal chest excursion, No wheezes and Non-labored breathing Cardiovascular: Regular rate and rhythm. No murmurs. and No edema, swelling or tenderness, except as noted in detailed exam. Integumentary: No impressive skin lesions present, except as noted in detailed exam. Musculoskeletal: Unremarkable, except as noted in detailed exam.  Left hip exam: The patient ambulates  with a mild to moderate limp, favoring her left leg, and uses a cane in her right hand for balance and support. Skin inspection of the left hip demonstrates her surgical incision to be well-healed and without evidence for infection. No swelling, erythema, ecchymosis, abrasions, or other skin abnormalities are identified. She has moderate tenderness to palpation over the lateral aspect of the left hip, and mild tenderness to palpation more posteriorly. She can arise from a seated position with moderate difficulty. In stance, her pelvis is level. She can heel raise and toe raise with some difficulty balance but no weakness. She can forward flex to 90 degrees and extend to 5 degrees. Her left hip can be flexed to 100 degrees and extended to 0 degrees. She shows 10 degrees of internal rotation and 25 degrees of external rotation. She has mild-moderate pain with internal and external rotation. She is neurovascularly intact to the left lower extremity and foot.  X-rays/MRI/Lab data:  Recent x-rays of the pelvis and left hip are available for review.  These films demonstrate moderate degenerative changes of the left hip with marked narrowing of the joint space. There are also appears to be some flattening of the femoral head superiorly. The femoral neck fracture appears to be well-healed and the screws are in good position and without evidence of loosening, other than being somewhat proud laterally. No new acute bony abnormalities are identified.  Assessment:  Post-traumatic osteoarthritis of left hip Yes   Closed intracapsular fracture of left femur with routine healing   Plan: The treatment options were discussed with the patient. In addition, patient educational materials were provided regarding the diagnosis and treatment options. The patient would like very much to avoid a total hip replacement if at all possible. However, she would like to have the screws taken out as she feels that they are contributing to  the pain she is experiencing in the lateral aspect of her hip. She feels that once the screws are out that she will be able to manage her arthritis pain with medicines. Therefore, I have recommended a surgical procedure, specifically removal of the 3 retained cannulated screws in her left hip. The procedure was discussed with the patient, as were the potential risks (including bleeding, infection, nerve and/or blood vessel injury, persistent or recurrent pain, stiffness of the hip, recurrent fracture, need for further surgery, blood clots, strokes, heart attacks and/or arhythmias, pneumonia, etc.) and benefits. The patient states her understanding and wishes to proceed. All of the patient's questions and concerns were answered. She can call any time with further concerns. She will follow up post-surgery, routine.   H&P reviewed and patient re-examined. No changes.

## 2021-06-25 NOTE — Transfer of Care (Signed)
Immediate Anesthesia Transfer of Care Note  Patient: Cynthia Ritter  Procedure(s) Performed: LEFT HIP HARDWARE REMOVAL (Left: Hip)  Patient Location: PACU  Anesthesia Type:General  Level of Consciousness: drowsy  Airway & Oxygen Therapy: Patient Spontanous Breathing and Patient connected to face mask oxygen  Post-op Assessment: Report given to RN  Post vital signs: stable  Last Vitals:  Vitals Value Taken Time  BP 139/69 06/25/21 1418  Temp    Pulse 75 06/25/21 1420  Resp 9 06/25/21 1420  SpO2 99 % 06/25/21 1420  Vitals shown include unvalidated device data.  Last Pain:  Vitals:   06/25/21 1117  TempSrc: Temporal  PainSc: 5          Complications: No notable events documented.

## 2021-06-25 NOTE — Discharge Instructions (Addendum)
Orthopedic discharge instructions: May shower with intact OpSite dressing.  Apply ice frequently to hip area. May resume Celebrex 200 mg daily. Take hydrocodone as prescribed or ES Tylenol if necessary. Partail weight-bear on left leg - use walker for balance and support. Follow-up in 10-14 days or as scheduled.  AMBULATORY SURGERY  DISCHARGE INSTRUCTIONS   The drugs that you were given will stay in your system until tomorrow so for the next 24 hours you should not:  Drive an automobile Make any legal decisions Drink any alcoholic beverage   You may resume regular meals tomorrow.  Today it is better to start with liquids and gradually work up to solid foods.  You may eat anything you prefer, but it is better to start with liquids, then soup and crackers, and gradually work up to solid foods.   Please notify your doctor immediately if you have any unusual bleeding, trouble breathing, redness and pain at the surgery site, drainage, fever, or pain not relieved by medication.    Additional Instructions:        Please contact your physician with any problems or Same Day Surgery at [phone], Monday through Friday 6 am to 4 pm, or [place] at [facility] number at [phone].

## 2021-06-25 NOTE — Progress Notes (Signed)
Spoke to dr. Erenest Rasher about elevated BP prior to discharge. Order given to give home dose of losartan.  Patient given losartan. BP recheck was 162/86. Dr. Erenest Rasher stated that was okay to discharge.

## 2021-06-25 NOTE — Anesthesia Preprocedure Evaluation (Signed)
Anesthesia Evaluation  Patient identified by MRN, date of birth, ID band Patient awake    Reviewed: Allergy & Precautions, NPO status , Patient's Chart, lab work & pertinent test results  History of Anesthesia Complications Negative for: history of anesthetic complications  Airway Mallampati: II  TM Distance: >3 FB Neck ROM: Full    Dental  (+) Edentulous Upper, Edentulous Lower, Dental Advidsory Given   Pulmonary neg pulmonary ROS, neg shortness of breath, neg sleep apnea, neg COPD, neg recent URI, Patient abstained from smoking.Not current smoker, former smoker,    Pulmonary exam normal breath sounds clear to auscultation       Cardiovascular Exercise Tolerance: Good METShypertension, (-) angina(-) CAD and (-) Past MI Normal cardiovascular exam(-) dysrhythmias  Rhythm:Regular Rate:Normal     Neuro/Psych PSYCHIATRIC DISORDERS Depression negative neurological ROS     GI/Hepatic negative GI ROS, Neg liver ROS, neg GERD  ,  Endo/Other  negative endocrine ROSneg diabetes  Renal/GU negative Renal ROS Bladder dysfunction      Musculoskeletal  (+) Arthritis ,   Abdominal   Peds negative pediatric ROS (+)  Hematology negative hematology ROS (+)   Anesthesia Other Findings Past Medical History: No date: Arthritis     Comment:  left heel/ knees and ankle, wears a knee brace               occasionally No date: Depression No date: Hot flashes No date: Hypertension No date: Incontinence     Comment:  URINARY No date: Motion sickness     Comment:  planes, back seat-cars No date: Seasonal allergies  Reproductive/Obstetrics negative OB ROS                             Anesthesia Physical  Anesthesia Plan  ASA: 2  Anesthesia Plan: General   Post-op Pain Management:    Induction: Intravenous  PONV Risk Score and Plan: 3 and Ondansetron, Dexamethasone and Treatment may vary due to age or  medical condition  Airway Management Planned: LMA  Additional Equipment: None  Intra-op Plan:   Post-operative Plan: Extubation in OR  Informed Consent: I have reviewed the patients History and Physical, chart, labs and discussed the procedure including the risks, benefits and alternatives for the proposed anesthesia with the patient or authorized representative who has indicated his/her understanding and acceptance.     Dental advisory given  Plan Discussed with: CRNA  Anesthesia Plan Comments: (Discussed risks of anesthesia with patient, including PONV, sore throat, lip/dental damage. Rare risks discussed as well, such as cardiorespiratory and neurological sequelae. Patient understands.)        Anesthesia Quick Evaluation

## 2021-06-26 ENCOUNTER — Encounter: Payer: Self-pay | Admitting: Surgery

## 2021-06-26 NOTE — Anesthesia Postprocedure Evaluation (Signed)
Anesthesia Post Note  Patient: Cynthia Ritter  Procedure(s) Performed: LEFT HIP HARDWARE REMOVAL (Left: Hip)  Patient location during evaluation: PACU Anesthesia Type: General Level of consciousness: awake and alert Pain management: pain level controlled Vital Signs Assessment: post-procedure vital signs reviewed and stable Respiratory status: spontaneous breathing, nonlabored ventilation, respiratory function stable and patient connected to nasal cannula oxygen Cardiovascular status: blood pressure returned to baseline and stable Postop Assessment: no apparent nausea or vomiting Anesthetic complications: no   No notable events documented.   Last Vitals:  Vitals:   06/25/21 1534 06/25/21 1600  BP: (!) 182/92 (!) 162/86  Pulse: 74 81  Resp: 15 16  Temp:  (!) 36.2 C  SpO2: 100% 100%    Last Pain:  Vitals:   06/25/21 1534  TempSrc:   PainSc: 2                  Martha Clan

## 2021-06-27 ENCOUNTER — Encounter: Payer: Self-pay | Admitting: Surgery

## 2021-08-16 ENCOUNTER — Ambulatory Visit
Admission: RE | Admit: 2021-08-16 | Discharge: 2021-08-16 | Disposition: A | Discharge status: Home / Self Care | Payer: Medicare Other | Source: Ambulatory Visit | Attending: Emergency Medicine | Admitting: Emergency Medicine

## 2021-08-16 VITALS — BP 147/86 | HR 61 | Temp 97.9°F | Resp 18 | Ht 64.0 in | Wt 177.0 lb

## 2021-08-16 DIAGNOSIS — R42 Dizziness and giddiness: Secondary | ICD-10-CM | POA: Diagnosis not present

## 2021-08-16 MED ORDER — MECLIZINE HCL 25 MG PO TABS: 25.0000 mg | ORAL_TABLET | Freq: Two times a day (BID) | ORAL | 0 refills | Status: DC | PRN

## 2021-08-16 NOTE — ED Triage Notes (Signed)
Pt c/o vertigo x10days. Pt states that it is only when she goes to bed at night and laying down. Pt states that it does not happen when standing up, sitting, or walking.  ? ?Pt was told to see an ENT but availability isn't until June 2023.  ?

## 2021-08-16 NOTE — ED Provider Notes (Addendum)
?[facility] ? ? ? ?CSN: [id number] ?Arrival date & time: 08/16/21  2952 ? ? ?  ? ?History   ?Chief Complaint ?Chief Complaint  ?Patient presents with  ? Dizziness  ? ? ?HPI ?JANNET CALIP is a 88 y.o. female.  ? ?Patient presents with dizziness for 10 days.  Dizziness is described as the room spinning.  Symptoms are worse at nighttime and when lying down, not present once upright, walking or standing .  Not worsened by movements.  Endorses history of vertigo and motion sickness but has not had flareup in years.  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  Has attempted use of Dramamine which has not been effective.  Denies lightheadedness, syncope, generalized weakness, memory or speech changes, nasal congestion or rhinorrhea.  History of seasonal allergies, not currently taking medications. ? ? ? ?Past Medical History:  ?Diagnosis Date  ? Arthritis   ? left heel/ knees and ankle, wears a knee brace occasionally  ? Depression   ? Hot flashes   ? Hypertension   ? Incontinence   ? URINARY  ? Motion sickness   ? planes, back seat-cars  ? Seasonal allergies   ? ? ?Patient Active Problem List  ? Diagnosis Date Noted  ? Femoral neck fracture ([place]) 04/16/2020  ? Hypertension   ? Depression   ? Incontinence   ? ? ?Past Surgical History:  ?Procedure Laterality Date  ? CARPAL TUNNEL RELEASE Right 06/13/2020  ? Procedure: CARPAL TUNNEL RELEASE ENDOSCOPIC;  Surgeon: Corky Mull, MD;  Location: [facility];  Service: Orthopedics;  Laterality: Right;  ? CATARACT EXTRACTION W/PHACO Right 09/26/2015  ? Procedure: CATARACT EXTRACTION PHACO AND INTRAOCULAR LENS PLACEMENT (I[facility]);  Surgeon: Leandrew Koyanagi, MD;  Location: [place];  Service: Ophthalmology;  Laterality: Right;  ? CATARACT EXTRACTION W/PHACO Left 10/24/2015  ? Procedure: CATARACT EXTRACTION PHACO AND INTRAOCULAR LENS PLACEMENT ([place]) left eye;  Surgeon: Leandrew Koyanagi, MD;  Location: [place];   Service: Ophthalmology;  Laterality: Left;  ? CHOLECYSTECTOMY    ? COLONOSCOPY    ? X2  ? HARDWARE REMOVAL Left 06/25/2021  ? Procedure: LEFT HIP HARDWARE REMOVAL;  Surgeon: Corky Mull, MD;  Location: [facility];  Service: Orthopedics;  Laterality: Left;  ? HIP PINNING,CANNULATED Left 04/18/2020  ? Procedure: CANNULATED HIP PINNING;  Surgeon: Leim Fabry, MD;  Location: [facility];  Service: Orthopedics;  Laterality: Left;  ? ? ?OB History   ?No obstetric history on file. ?  ? ? ? ?Home Medications   ? ?Prior to Admission medications   ?Medication Sig Start Date End Date Taking? Authorizing Provider  ?acetaminophen (TYLENOL) 500 MG tablet Take 1,000 mg by mouth every 6 (six) hours as needed for moderate pain or mild pain.   Yes [provider]  ?alendronate (FOSAMAX) 70 MG tablet Take 70 mg by mouth once a week. 06/14/20  Yes [provider]  ?aspirin-sod bicarb-citric acid (ALKA-SELTZER) 325 MG TBEF tablet Take 325 mg by mouth every 6 (six) hours as needed (indigestion).   Yes [provider]  ?celecoxib (CELEBREX) 200 MG capsule Take 200 mg by mouth 2 (two) times daily. 05/04/21  Yes [provider]  ?FLUoxetine (PROZAC) 10 MG capsule Take 10 mg by mouth in the morning.   Yes [provider]  ?FLUoxetine (PROZAC) 20 MG capsule Take 20 mg by mouth daily.   Yes [provider]  ?HYDROcodone-acetaminophen (NORCO/VICODIN) 5-325 MG tablet Take 1 tablet  by mouth every 6 (six) hours as needed for severe pain. 06/25/21  Yes Poggi, Marshall Cork, MD  ?losartan (COZAAR) 25 MG tablet Take 25 mg by mouth every morning. 01/14/20  Yes [provider]  ?MYRBETRIQ 25 MG TB24 tablet Take 25 mg by mouth every morning. 01/15/20  Yes [provider]  ?oxymetazoline (AFRIN) 0.05 % nasal spray Place 1 spray into both nostrils at bedtime as needed for congestion.   Yes [provider]  ?tolterodine (DETROL LA) 4 MG 24 hr capsule Take 4 mg by mouth every morning.   Yes  [provider]  ?vitamin B-12 (CYANOCOBALAMIN) 1000 MCG tablet Take 1,000 mcg by mouth every other day.   Yes [provider]  ? ? ?Family History ?Family History  ?Problem Relation Age of Onset  ? Heart failure Mother   ? Hypertension Mother   ? Asthma Mother   ? Parkinson's disease Mother   ? Diabetes Mother   ? Heart failure Father   ? Cirrhosis Sister   ? ? ?Social History ?Social History  ? ?Tobacco Use  ? Smoking status: Former  ? Smokeless tobacco: Never  ? Tobacco comments:  ?  smoked in college  ?Vaping Use  ? Vaping Use: Never used  ?Substance Use Topics  ? Alcohol use: Yes  ?  Comment: may drink 1x/month  ? Drug use: Never  ? ? ? ?Allergies   ?Patient has no known allergies. ? ? ?Review of Systems ?Review of Systems  ?Constitutional: Negative.   ?HENT: Negative.    ?Respiratory: Negative.    ?Cardiovascular: Negative.   ?Skin: Negative.   ?Neurological:  Positive for dizziness. Negative for tremors, seizures, syncope, facial asymmetry, speech difficulty, weakness, light-headedness, numbness and headaches.  ? ? ?Physical Exam ?Triage Vital Signs ?ED Triage Vitals  ?Enc Vitals Group  ?   BP 08/16/21 1037 (!) 147/86  ?   Pulse Rate 08/16/21 1037 61  ?   Resp 08/16/21 1037 18  ?   Temp 08/16/21 1037 97.9 ?F (36.6 ?C)  ?   Temp Source 08/16/21 1037 Oral  ?   SpO2 08/16/21 1037 100 %  ?   Weight 08/16/21 1034 177 lb (80.3 kg)  ?   Height 08/16/21 1034 '5\' 4"'$  (1.626 m)  ?   Head Circumference --   ?   Peak Flow --   ?   Pain Score 08/16/21 1034 0  ?   Pain Loc --   ?   Pain Edu? --   ?   Excl. in [place]? --   ? ?No data found. ? ?Updated Vital Signs ?BP (!) 147/86 (BP Location: Left Arm)   Pulse 61   Temp 97.9 ?F (36.6 ?C) (Oral)   Resp 18   Ht '5\' 4"'$  (1.626 m)   Wt 177 lb (80.3 kg)   SpO2 100%   BMI 30.38 kg/m?  ? ?Visual Acuity ?Right Eye Distance:   ?Left Eye Distance:   ?Bilateral Distance:   ? ?Right Eye Near:   ?Left Eye Near:    ?Bilateral Near:    ? ?Physical Exam ?Constitutional:   ?    Appearance: Normal appearance.  ?HENT:  ?   Head: Normocephalic.  ?   Right Ear: Tympanic membrane, ear canal and external ear normal.  ?   Left Ear: Tympanic membrane, ear canal and external ear normal.  ?Eyes:  ?   Extraocular Movements: Extraocular movements intact.  ?Pulmonary:  ?   Effort: Pulmonary effort  is normal.  ?Neurological:  ?   General: No focal deficit present.  ?   Mental Status: She is alert and oriented to person, place, and time. Mental status is at baseline.  ?   Cranial Nerves: No cranial nerve deficit.  ?   Sensory: No sensory deficit.  ?   Motor: No weakness.  ?   Gait: Gait normal.  ?Psychiatric:     ?   Mood and Affect: Mood normal.     ?   Behavior: Behavior normal.  ? ? ? ?UC Treatments / Results  ?Labs ?(all labs ordered are listed, but only abnormal results are displayed) ?Labs Reviewed - No data to display ? ?EKG ? ? ?Radiology ?No results found. ? ?Procedures ?Procedures (including critical care time) ? ?Medications Ordered in UC ?Medications - No data to display ? ?Initial Impression / Assessment and Plan / UC Course  ?I have reviewed the triage vital signs and the nursing notes. ? ?Pertinent labs & imaging results that were available during my care of the patient were reviewed by me and considered in my medical decision making (see chart for details). ? ?Vertigo ? ?Vital signs are stable, no abnormalities noted on neurological exam, discussed findings with patient, as patient is able to tolerate Dramamine without complication, will order meclizine 25 mg twice daily as needed, recommended patient start dosage at 12.5 mg and may titrate to 25 mg if symptoms have not resolved, given walking referral to [facility] as appointment is with [facility] [place] to attempt to get sooner appointment, patient may also follow-up with PCP, verbalized understanding of all treatment plans ?Final Clinical Impressions(s) / UC Diagnoses  ? ?Final diagnoses:  ?None  ? ?Discharge Instructions   ?None ?   ? ?ED Prescriptions   ?None ?  ? ?PDMP not reviewed this encounter. ?  ?Hans Eden, NP ?08/16/21 1121 ? ?  ?Hans Eden, NP ?08/16/21 1121 ? ?

## 2021-08-16 NOTE — Discharge Instructions (Signed)
If your symptoms continue to persist she will need further evaluation from the ear nose and throat, keep your upcoming June appointment, information for the [facility] office has been listed on your paperwork for you to reach out to them as well ? ?If everything is normal from the ear nose and throat standpoint you may follow-up with your primary doctor to receive a referral to neurology ? ?On today's exam there was no neurological abnormalities noted ? ?As you are able to tolerate Dramamine we will attempt use of meclizine which is a similar medicine and a stronger dosage to attempt to resolve your symptoms ? ?You may begin with a half a tablet of meclizine using twice daily as needed, if symptoms improved but do not resolve you may attempt use of a whole tablet ? ?Please follow-up with urgent care as needed ?

## 2022-01-02 ENCOUNTER — Other Ambulatory Visit: Payer: Self-pay | Admitting: Surgery

## 2022-01-03 ENCOUNTER — Encounter
Admission: RE | Admit: 2022-01-03 | Discharge: 2022-01-03 | Disposition: A | Discharge status: Home / Self Care | Payer: Medicare Other | Source: Ambulatory Visit | Attending: Surgery | Admitting: Surgery

## 2022-01-03 VITALS — Ht 64.0 in | Wt 180.0 lb

## 2022-01-03 DIAGNOSIS — I10 Essential (primary) hypertension: Secondary | ICD-10-CM | POA: Diagnosis not present

## 2022-01-03 DIAGNOSIS — Z01812 Encounter for preprocedural laboratory examination: Secondary | ICD-10-CM | POA: Insufficient documentation

## 2022-01-03 DIAGNOSIS — Z01818 Encounter for other preprocedural examination: Secondary | ICD-10-CM

## 2022-01-03 HISTORY — DX: Gastro-esophageal reflux disease without esophagitis: K21.9

## 2022-01-03 HISTORY — DX: Hyperlipidemia, unspecified: E78.5

## 2022-01-03 HISTORY — DX: Fracture of unspecified part of neck of unspecified femur, initial encounter for closed fracture: S72.009A

## 2022-01-03 HISTORY — DX: Other specified disorders of bone density and structure, unspecified site: M85.80

## 2022-01-03 HISTORY — DX: Unspecified malignant neoplasm of skin, unspecified: C44.90

## 2022-01-03 LAB — COMPREHENSIVE METABOLIC PANEL
ALT: 15 U/L (ref 0–44)
AST: 21 U/L (ref 15–41)
Albumin: 3.9 g/dL (ref 3.5–5.0)
Alkaline Phosphatase: 54 U/L (ref 38–126)
Anion gap: 7 (ref 5–15)
BUN: 24 mg/dL — ABNORMAL HIGH (ref 8–23)
CO2: 26 mmol/L (ref 22–32)
Calcium: 9 mg/dL (ref 8.9–10.3)
Chloride: 107 mmol/L (ref 98–111)
Creatinine, Ser: 0.85 mg/dL (ref 0.44–1.00)
GFR, Estimated: >60 mL/min (ref >60)
Glucose, Bld: 89 mg/dL (ref 70–99)
Potassium: 4.5 mmol/L (ref 3.5–5.1)
Sodium: 140 mmol/L (ref 135–145)
Total Bilirubin: 0.8 mg/dL (ref 0.3–1.2)
Total Protein: 7.1 g/dL (ref 6.5–8.1)

## 2022-01-03 LAB — DIFFERENTIAL
Abs Immature Granulocytes: 0.02 10*3/uL (ref 0.00–0.07)
Basophils Absolute: 0.1 10*3/uL (ref 0.0–0.1)
Basophils Relative: 1 %
Eosinophils Absolute: 0.2 10*3/uL (ref 0.0–0.5)
Eosinophils Relative: 3 %
Immature Granulocytes: 0 %
Lymphocytes Relative: 19 %
Lymphs Abs: 1.4 10*3/uL (ref 0.7–4.0)
Monocytes Absolute: 0.9 10*3/uL (ref 0.1–1.0)
Monocytes Relative: 12 %
Neutro Abs: 4.8 10*3/uL (ref 1.7–7.7)
Neutrophils Relative %: 65 %

## 2022-01-03 LAB — CBC
HCT: 39.7 % (ref 36.0–46.0)
Hemoglobin: 12.8 g/dL (ref 12.0–15.0)
MCH: 31.2 pg (ref 26.0–34.0)
MCHC: 32.2 g/dL (ref 30.0–36.0)
MCV: 96.8 fL (ref 80.0–100.0)
Platelets: 290 10*3/uL (ref 150–400)
RBC: 4.1 MIL/uL (ref 3.87–5.11)
RDW: 13.6 % (ref 11.5–15.5)
WBC: 7.5 10*3/uL (ref 4.0–10.5)
nRBC: 0 % (ref 0.0–0.2)

## 2022-01-03 LAB — URINALYSIS, ROUTINE W REFLEX MICROSCOPIC
Bacteria, UA: NONE SEEN
Bilirubin Urine: NEGATIVE
Glucose, UA: NEGATIVE mg/dL
Hgb urine dipstick: NEGATIVE
Ketones, ur: NEGATIVE mg/dL
Nitrite: NEGATIVE
Protein, ur: NEGATIVE mg/dL
Specific Gravity, Urine: 1.02 (ref 1.005–1.030)
pH: 6 (ref 5.0–8.0)

## 2022-01-03 LAB — SURGICAL PCR SCREEN
MRSA, PCR: NEGATIVE
Staphylococcus aureus: NEGATIVE

## 2022-01-03 NOTE — Patient Instructions (Signed)
Your procedure is scheduled on: 01/09/22 Report to [place]. To find out your arrival time please call [phone] between 1PM - 3PM on 01/08/22.  Remember: Instructions that are not followed completely may result in serious medical risk, up to and including death, or upon the discretion of your surgeon and anesthesiologist your surgery may need to be rescheduled.     _X__ 1. Do not eat food after midnight the night before your procedure.                 No gum chewing or hard candies. You may drink clear liquids up to 2 hours                 before you are scheduled to arrive for your surgery- DO not drink clear                 liquids within 2 hours of the start of your surgery.                 Clear Liquids include:  water, apple juice without pulp, clear carbohydrate                 drink such as Clearfast or Gatorade, Black Coffee or Tea (Do not add                 anything to coffee or tea). Diabetics water only  Ensure pre surgery drink consumed 2 hours before arrival  __X__2.  On the morning of surgery brush your teeth with toothpaste and water, you                 may rinse your mouth with mouthwash if you wish.  Do not swallow any              toothpaste of mouthwash.     _X__ 3.  No Alcohol for 24 hours before or after surgery.   _X__ 4.  Do Not Smoke or use e-cigarettes For 24 Hours Prior to Your Surgery.                 Do not use any chewable tobacco products for at least 6 hours prior to                 surgery.  ____  5.  Bring all medications with you on the day of surgery if instructed.   __X__  6.  Notify your doctor if there is any change in your medical condition      (cold, fever, infections).     Do not wear jewelry, make-up, hairpins, clips or nail polish. Do not wear lotions, powders, or perfumes.  Do not shave body hair 48 hours prior to surgery. Men may shave face and neck. Do not bring valuables to the  hospital.    [facility] is not responsible for any belongings or valuables.  Contacts, dentures/partials or body piercings may not be worn into surgery. Bring a case for your contacts, glasses or hearing aids, a denture cup will be supplied. Leave your suitcase in the car. After surgery it may be brought to your room. For patients admitted to the hospital, discharge time is determined by your treatment team.   Patients discharged the day of surgery will not be allowed to drive home.   Please read over the following fact sheets that you were given:   MRSA Information, CHG soap, Incentive Spirometer  __X__ Take these medicines the morning of surgery with A SIP OF WATER:    1. famotidine (PEPCID) 20 MG tablet  2. FLUoxetine (PROZAC) 30 MG capsule  3. MYRBETRIQ 25 MG TB24 tablet  4. tolterodine (DETROL LA) 4 MG 24 hr capsule  5. HYDROcodone-acetaminophen (NORCO/VICODIN) 5-325 MG tablet if needed  6.  ____ Fleet Enema (as directed)   __X__ Use CHG Soap/SAGE wipes as directed  ____ Use inhalers on the day of surgery  ____ Stop metformin/Janumet/Farxiga 2 days prior to surgery    ____ Take 1/2 of usual insulin dose the night before surgery. No insulin the morning          of surgery.   ____ Stop Blood Thinners Coumadin/Plavix/Xarelto/Pleta/Pradaxa/Eliquis/Effient/Aspirin  on   Or contact your Surgeon, Cardiologist or Medical Doctor regarding  ability to stop your blood thinners  __X__ Stop Anti-inflammatories 7 days before surgery such as Advil, Ibuprofen, Motrin,  BC or Goodies Powder, Naprosyn, Naproxen, Aleve, Aspirin   HOLD YOUR DICLOFENAC TABLET TONIGHT UNTIL AFTER SURGERY  __X__ Stop all herbals and supplements, fish oil or vitamins  until after surgery.    ____ Bring C-Pap to the hospital.

## 2022-01-07 LAB — TYPE AND SCREEN
ABO/RH(D): O POS
Antibody Screen: NEGATIVE
Extend sample reason: UNDETERMINED

## 2022-01-09 ENCOUNTER — Ambulatory Visit: Payer: Medicare Other | Admitting: Urgent Care

## 2022-01-09 ENCOUNTER — Observation Stay
Admission: RE | Admit: 2022-01-09 | Discharge: 2022-01-10 | Disposition: A | Discharge status: Home Health | Payer: Medicare Other | Source: Ambulatory Visit | Attending: Surgery | Admitting: Surgery

## 2022-01-09 ENCOUNTER — Ambulatory Visit: Payer: Medicare Other | Admitting: Anesthesiology

## 2022-01-09 ENCOUNTER — Other Ambulatory Visit: Payer: Self-pay

## 2022-01-09 ENCOUNTER — Encounter
Admission: RE | Disposition: A | Discharge status: Home Health | Payer: Self-pay | Source: Ambulatory Visit | Attending: Surgery

## 2022-01-09 ENCOUNTER — Encounter: Payer: Self-pay | Admitting: Surgery

## 2022-01-09 ENCOUNTER — Observation Stay: Payer: Medicare Other

## 2022-01-09 DIAGNOSIS — Z79899 Other long term (current) drug therapy: Secondary | ICD-10-CM | POA: Diagnosis not present

## 2022-01-09 DIAGNOSIS — Z96642 Presence of left artificial hip joint: Secondary | ICD-10-CM

## 2022-01-09 DIAGNOSIS — Z85828 Personal history of other malignant neoplasm of skin: Secondary | ICD-10-CM | POA: Diagnosis not present

## 2022-01-09 DIAGNOSIS — Z87891 Personal history of nicotine dependence: Secondary | ICD-10-CM | POA: Insufficient documentation

## 2022-01-09 DIAGNOSIS — Z8616 Personal history of COVID-19: Secondary | ICD-10-CM | POA: Diagnosis not present

## 2022-01-09 DIAGNOSIS — Z7982 Long term (current) use of aspirin: Secondary | ICD-10-CM | POA: Diagnosis not present

## 2022-01-09 DIAGNOSIS — I10 Essential (primary) hypertension: Secondary | ICD-10-CM | POA: Diagnosis not present

## 2022-01-09 DIAGNOSIS — M1612 Unilateral primary osteoarthritis, left hip: Principal | ICD-10-CM | POA: Insufficient documentation

## 2022-01-09 HISTORY — PX: TOTAL HIP ARTHROPLASTY: SHX124

## 2022-01-09 LAB — CBC WITH DIFFERENTIAL/PLATELET
Abs Immature Granulocytes: 0.03 10*3/uL (ref 0.00–0.07)
Basophils Absolute: 0.1 10*3/uL (ref 0.0–0.1)
Basophils Relative: 1 %
Eosinophils Absolute: 0.2 10*3/uL (ref 0.0–0.5)
Eosinophils Relative: 2 %
HCT: 41 % (ref 36.0–46.0)
Hemoglobin: 13.1 g/dL (ref 12.0–15.0)
Immature Granulocytes: 0 %
Lymphocytes Relative: 21 %
Lymphs Abs: 1.6 10*3/uL (ref 0.7–4.0)
MCH: 31.3 pg (ref 26.0–34.0)
MCHC: 32 g/dL (ref 30.0–36.0)
MCV: 98.1 fL (ref 80.0–100.0)
Monocytes Absolute: 0.9 10*3/uL (ref 0.1–1.0)
Monocytes Relative: 11 %
Neutro Abs: 4.8 10*3/uL (ref 1.7–7.7)
Neutrophils Relative %: 65 %
Platelets: 277 10*3/uL (ref 150–400)
RBC: 4.18 MIL/uL (ref 3.87–5.11)
RDW: 13.5 % (ref 11.5–15.5)
WBC: 7.5 10*3/uL (ref 4.0–10.5)
nRBC: 0 % (ref 0.0–0.2)

## 2022-01-09 LAB — COMPREHENSIVE METABOLIC PANEL
ALT: 15 U/L (ref 0–44)
AST: 20 U/L (ref 15–41)
Albumin: 4.2 g/dL (ref 3.5–5.0)
Alkaline Phosphatase: 56 U/L (ref 38–126)
Anion gap: 7 (ref 5–15)
BUN: 16 mg/dL (ref 8–23)
CO2: 26 mmol/L (ref 22–32)
Calcium: 9.1 mg/dL (ref 8.9–10.3)
Chloride: 108 mmol/L (ref 98–111)
Creatinine, Ser: 0.93 mg/dL (ref 0.44–1.00)
GFR, Estimated: 60 mL/min — ABNORMAL LOW (ref >60)
Glucose, Bld: 94 mg/dL (ref 70–99)
Potassium: 3.9 mmol/L (ref 3.5–5.1)
Sodium: 141 mmol/L (ref 135–145)
Total Bilirubin: 0.7 mg/dL (ref 0.3–1.2)
Total Protein: 7.2 g/dL (ref 6.5–8.1)

## 2022-01-09 LAB — TYPE AND SCREEN
ABO/RH(D): O POS
Antibody Screen: NEGATIVE

## 2022-01-09 SURGERY — ARTHROPLASTY, HIP, TOTAL,POSTERIOR APPROACH
Anesthesia: Spinal | Site: Hip | Laterality: Left

## 2022-01-09 MED ORDER — CEFAZOLIN SODIUM-DEXTROSE 2-4 GM/100ML-% IV SOLN
2.0000 g | Freq: Four times a day (QID) | INTRAVENOUS | Status: AC
  Administered 2022-01-09 – 2022-01-10 (×3): 2 g via INTRAVENOUS
  Filled 2022-01-09 (×4): qty 100

## 2022-01-09 MED ORDER — PROPOFOL 10 MG/ML IV BOLUS
INTRAVENOUS | Status: AC
  Filled 2022-01-09: qty 20

## 2022-01-09 MED ORDER — OXYCODONE HCL 5 MG/5ML PO SOLN: 5.0000 mg | Freq: Once | ORAL | Status: DC | PRN

## 2022-01-09 MED ORDER — ASPIRIN EFFERVESCENT 325 MG PO TBEF: 325.0000 mg | EFFERVESCENT_TABLET | Freq: Four times a day (QID) | ORAL | Status: DC | PRN

## 2022-01-09 MED ORDER — DEXAMETHASONE SODIUM PHOSPHATE 10 MG/ML IJ SOLN
INTRAMUSCULAR | Status: DC | PRN
  Administered 2022-01-09: 5 mg via INTRAVENOUS

## 2022-01-09 MED ORDER — FAMOTIDINE 20 MG PO TABS: 20.0000 mg | ORAL_TABLET | Freq: Every day | ORAL | Status: DC | PRN

## 2022-01-09 MED ORDER — CEFAZOLIN SODIUM-DEXTROSE 2-4 GM/100ML-% IV SOLN
INTRAVENOUS | Status: AC
  Filled 2022-01-09: qty 100

## 2022-01-09 MED ORDER — ACETAMINOPHEN 10 MG/ML IV SOLN: 1000.0000 mg | Freq: Once | INTRAVENOUS | Status: DC | PRN

## 2022-01-09 MED ORDER — ONDANSETRON HCL 4 MG/2ML IJ SOLN: 4.0000 mg | Freq: Four times a day (QID) | INTRAMUSCULAR | Status: DC | PRN

## 2022-01-09 MED ORDER — KETOROLAC TROMETHAMINE 15 MG/ML IJ SOLN
INTRAMUSCULAR | Status: AC
  Administered 2022-01-09: 15 mg via INTRAVENOUS
  Filled 2022-01-09: qty 1

## 2022-01-09 MED ORDER — ACETAMINOPHEN 500 MG PO TABS: 1000.0000 mg | ORAL_TABLET | Freq: Once | ORAL | Status: AC

## 2022-01-09 MED ORDER — DIPHENHYDRAMINE HCL 12.5 MG/5ML PO ELIX: 12.5000 mg | ORAL_SOLUTION | ORAL | Status: DC | PRN

## 2022-01-09 MED ORDER — MIDAZOLAM HCL 2 MG/2ML IJ SOLN
INTRAMUSCULAR | Status: AC
  Filled 2022-01-09: qty 2

## 2022-01-09 MED ORDER — CHLORHEXIDINE GLUCONATE 0.12 % MT SOLN: 15.0000 mL | Freq: Once | OROMUCOSAL | Status: AC

## 2022-01-09 MED ORDER — CHLORHEXIDINE GLUCONATE 0.12 % MT SOLN
OROMUCOSAL | Status: AC
  Administered 2022-01-09: 15 mL via OROMUCOSAL
  Filled 2022-01-09: qty 15

## 2022-01-09 MED ORDER — LOSARTAN POTASSIUM 25 MG PO TABS
25.0000 mg | ORAL_TABLET | Freq: Every day | ORAL | Status: DC
Start: 2022-01-10 — End: 2022-01-10

## 2022-01-09 MED ORDER — BUPIVACAINE HCL (PF) 0.5 % IJ SOLN
INTRAMUSCULAR | Status: DC | PRN
  Administered 2022-01-09: 2.6 mL

## 2022-01-09 MED ORDER — FLUOXETINE HCL 20 MG PO CAPS: 20.0000 mg | ORAL_CAPSULE | Freq: Every day | ORAL | Status: DC

## 2022-01-09 MED ORDER — BUPIVACAINE LIPOSOME 1.3 % IJ SUSP
INTRAMUSCULAR | Status: AC
  Filled 2022-01-09: qty 20

## 2022-01-09 MED ORDER — DROPERIDOL 2.5 MG/ML IJ SOLN: 0.6250 mg | Freq: Once | INTRAMUSCULAR | Status: AC | PRN

## 2022-01-09 MED ORDER — PROPOFOL 500 MG/50ML IV EMUL
INTRAVENOUS | Status: DC | PRN
  Administered 2022-01-09: 90 ug/kg/min via INTRAVENOUS

## 2022-01-09 MED ORDER — PROMETHAZINE HCL 25 MG/ML IJ SOLN: 6.2500 mg | INTRAMUSCULAR | Status: DC | PRN

## 2022-01-09 MED ORDER — SODIUM CHLORIDE 0.9 % IV SOLN: INTRAVENOUS | Status: DC

## 2022-01-09 MED ORDER — MORPHINE SULFATE (PF) 2 MG/ML IV SOLN: 0.5000 mg | INTRAVENOUS | Status: DC | PRN

## 2022-01-09 MED ORDER — ACETAMINOPHEN 500 MG PO TABS
ORAL_TABLET | ORAL | Status: AC
  Administered 2022-01-09: 1000 mg via ORAL
  Filled 2022-01-09: qty 2

## 2022-01-09 MED ORDER — TRIAMCINOLONE ACETONIDE 40 MG/ML IJ SUSP
INTRAMUSCULAR | Status: AC
  Filled 2022-01-09: qty 2

## 2022-01-09 MED ORDER — ONDANSETRON HCL 4 MG PO TABS: 4.0000 mg | ORAL_TABLET | Freq: Four times a day (QID) | ORAL | Status: DC | PRN

## 2022-01-09 MED ORDER — TRANEXAMIC ACID 1000 MG/10ML IV SOLN
INTRAVENOUS | Status: AC
  Filled 2022-01-09: qty 10

## 2022-01-09 MED ORDER — DROPERIDOL 2.5 MG/ML IJ SOLN
INTRAMUSCULAR | Status: AC
  Administered 2022-01-09: 0.625 mg via INTRAVENOUS
  Filled 2022-01-09: qty 2

## 2022-01-09 MED ORDER — BISACODYL 10 MG RE SUPP: 10.0000 mg | Freq: Every day | RECTAL | Status: DC | PRN

## 2022-01-09 MED ORDER — PHENYLEPHRINE HCL (PRESSORS) 10 MG/ML IV SOLN
INTRAVENOUS | Status: AC
  Filled 2022-01-09: qty 1

## 2022-01-09 MED ORDER — ORAL CARE MOUTH RINSE: 15.0000 mL | Freq: Once | OROMUCOSAL | Status: AC

## 2022-01-09 MED ORDER — SODIUM CHLORIDE FLUSH 0.9 % IV SOLN
INTRAVENOUS | Status: AC
  Filled 2022-01-09: qty 40

## 2022-01-09 MED ORDER — MIDAZOLAM HCL 5 MG/5ML IJ SOLN
INTRAMUSCULAR | Status: DC | PRN
  Administered 2022-01-09: 1 mg via INTRAVENOUS

## 2022-01-09 MED ORDER — GLYCOPYRROLATE PF 0.2 MG/ML IJ SOSY
PREFILLED_SYRINGE | INTRAMUSCULAR | Status: DC | PRN
  Administered 2022-01-09: .2 mg via INTRAVENOUS

## 2022-01-09 MED ORDER — ONDANSETRON HCL 4 MG/2ML IJ SOLN
INTRAMUSCULAR | Status: DC | PRN
  Administered 2022-01-09: 4 mg via INTRAVENOUS

## 2022-01-09 MED ORDER — CEFAZOLIN SODIUM-DEXTROSE 2-4 GM/100ML-% IV SOLN
2.0000 g | INTRAVENOUS | Status: AC
  Administered 2022-01-09: 2 g via INTRAVENOUS

## 2022-01-09 MED ORDER — ACETAMINOPHEN 325 MG PO TABS: 325.0000 mg | ORAL_TABLET | Freq: Four times a day (QID) | ORAL | Status: DC | PRN

## 2022-01-09 MED ORDER — FENTANYL CITRATE (PF) 100 MCG/2ML IJ SOLN: 25.0000 ug | INTRAMUSCULAR | Status: DC | PRN

## 2022-01-09 MED ORDER — PHENYLEPHRINE HCL-NACL 20-0.9 MG/250ML-% IV SOLN
INTRAVENOUS | Status: DC | PRN
  Administered 2022-01-09: 30 ug/min via INTRAVENOUS

## 2022-01-09 MED ORDER — OXYCODONE HCL 5 MG PO TABS: 5.0000 mg | ORAL_TABLET | Freq: Once | ORAL | Status: DC | PRN

## 2022-01-09 MED ORDER — METOCLOPRAMIDE HCL 5 MG PO TABS: 5.0000 mg | ORAL_TABLET | Freq: Three times a day (TID) | ORAL | Status: DC | PRN

## 2022-01-09 MED ORDER — KETOROLAC TROMETHAMINE 30 MG/ML IJ SOLN
INTRAMUSCULAR | Status: AC
  Filled 2022-01-09: qty 1

## 2022-01-09 MED ORDER — FLEET ENEMA 7-19 GM/118ML RE ENEM: 1.0000 | ENEMA | Freq: Once | RECTAL | Status: DC | PRN

## 2022-01-09 MED ORDER — TRIAMCINOLONE ACETONIDE 40 MG/ML IJ SUSP
INTRAMUSCULAR | Status: DC | PRN
  Administered 2022-01-09: 93 mL

## 2022-01-09 MED ORDER — KETOROLAC TROMETHAMINE 15 MG/ML IJ SOLN: 15.0000 mg | Freq: Once | INTRAMUSCULAR | Status: AC

## 2022-01-09 MED ORDER — OXYMETAZOLINE HCL 0.05 % NA SOLN: 1.0000 | Freq: Every evening | NASAL | Status: DC | PRN

## 2022-01-09 MED ORDER — FLUOXETINE HCL 20 MG PO CAPS
30.0000 mg | ORAL_CAPSULE | Freq: Every day | ORAL | Status: DC
  Administered 2022-01-10: 30 mg via ORAL
  Filled 2022-01-09: qty 1

## 2022-01-09 MED ORDER — SODIUM CHLORIDE 0.9 % IR SOLN
Status: DC | PRN
  Administered 2022-01-09: 3000 mL

## 2022-01-09 MED ORDER — 0.9 % SODIUM CHLORIDE (POUR BTL) OPTIME
TOPICAL | Status: DC | PRN
  Administered 2022-01-09: 500 mL

## 2022-01-09 MED ORDER — LACTATED RINGERS IV SOLN: INTRAVENOUS | Status: DC

## 2022-01-09 MED ORDER — KETOROLAC TROMETHAMINE 15 MG/ML IJ SOLN
7.5000 mg | Freq: Four times a day (QID) | INTRAMUSCULAR | Status: AC
  Administered 2022-01-09 – 2022-01-10 (×3): 7.5 mg via INTRAVENOUS
  Filled 2022-01-09 (×3): qty 1

## 2022-01-09 MED ORDER — PHENYLEPHRINE HCL (PRESSORS) 10 MG/ML IV SOLN
INTRAVENOUS | Status: DC | PRN
  Administered 2022-01-09 (×4): 80 ug via INTRAVENOUS

## 2022-01-09 MED ORDER — HYDROCODONE-ACETAMINOPHEN 5-325 MG PO TABS: 1.0000 | ORAL_TABLET | ORAL | Status: DC | PRN

## 2022-01-09 MED ORDER — FLUOXETINE HCL 10 MG PO CAPS: 10.0000 mg | ORAL_CAPSULE | Freq: Every morning | ORAL | Status: DC

## 2022-01-09 MED ORDER — PROPOFOL 10 MG/ML IV BOLUS
INTRAVENOUS | Status: AC
  Filled 2022-01-09: qty 60

## 2022-01-09 MED ORDER — ACETAMINOPHEN 500 MG PO TABS
500.0000 mg | ORAL_TABLET | Freq: Four times a day (QID) | ORAL | Status: AC
  Administered 2022-01-09 – 2022-01-10 (×4): 500 mg via ORAL
  Filled 2022-01-09 (×4): qty 1

## 2022-01-09 MED ORDER — FESOTERODINE FUMARATE ER 4 MG PO TB24
4.0000 mg | ORAL_TABLET | Freq: Every day | ORAL | Status: DC
  Administered 2022-01-10: 4 mg via ORAL
  Filled 2022-01-09: qty 1

## 2022-01-09 MED ORDER — METOCLOPRAMIDE HCL 5 MG/ML IJ SOLN: 5.0000 mg | Freq: Three times a day (TID) | INTRAMUSCULAR | Status: DC | PRN

## 2022-01-09 MED ORDER — MIRABEGRON ER 25 MG PO TB24
25.0000 mg | ORAL_TABLET | Freq: Every day | ORAL | Status: DC
  Administered 2022-01-10: 25 mg via ORAL
  Filled 2022-01-09: qty 1

## 2022-01-09 MED ORDER — VITAMIN B-12 1000 MCG PO TABS
1000.0000 ug | ORAL_TABLET | ORAL | Status: DC
  Administered 2022-01-10: 1000 ug via ORAL
  Filled 2022-01-09: qty 1

## 2022-01-09 MED ORDER — DOCUSATE SODIUM 100 MG PO CAPS
100.0000 mg | ORAL_CAPSULE | Freq: Two times a day (BID) | ORAL | Status: DC
  Administered 2022-01-09: 100 mg via ORAL
  Filled 2022-01-09: qty 1

## 2022-01-09 MED ORDER — SODIUM CHLORIDE 0.9 % IV SOLN
INTRAVENOUS | Status: DC | PRN
  Administered 2022-01-09: 100.2 mL via TOPICAL

## 2022-01-09 MED ORDER — MAGNESIUM HYDROXIDE 400 MG/5ML PO SUSP: 30.0000 mL | Freq: Every day | ORAL | Status: DC | PRN

## 2022-01-09 MED ORDER — GLYCOPYRROLATE 0.2 MG/ML IJ SOLN
INTRAMUSCULAR | Status: AC
Start: 2022-01-09 — End: ?
  Filled 2022-01-09: qty 1

## 2022-01-09 MED ORDER — TRANEXAMIC ACID 1000 MG/10ML IV SOLN
INTRAVENOUS | Status: DC | PRN
  Administered 2022-01-09: 1000 mg via TOPICAL

## 2022-01-09 MED ORDER — BUPIVACAINE-EPINEPHRINE (PF) 0.5% -1:200000 IJ SOLN
INTRAMUSCULAR | Status: AC
  Filled 2022-01-09: qty 30

## 2022-01-09 MED ORDER — APIXABAN 2.5 MG PO TABS
2.5000 mg | ORAL_TABLET | Freq: Two times a day (BID) | ORAL | Status: DC
  Administered 2022-01-10: 2.5 mg via ORAL
  Filled 2022-01-09: qty 1

## 2022-01-09 SURGICAL SUPPLY — 64 items
BIT DRILL JC 5IN 2.7M 127 30FL (BIT) IMPLANT
BLADE SAGITTAL WIDE XTHICK NO (BLADE) ×1 IMPLANT
BLADE SURG SZ20 CARB STEEL (BLADE) ×1 IMPLANT
CEMENT BONE 40GM (Cement) IMPLANT
CHLORAPREP W/TINT 26 (MISCELLANEOUS) ×1 IMPLANT
DRAPE 3/4 80X56 (DRAPES) ×1 IMPLANT
DRAPE IMP U-DRAPE 54X76 (DRAPES) IMPLANT
DRAPE INCISE IOBAN 66X60 STRL (DRAPES) ×1 IMPLANT
DRAPE SURG 17X11 SM STRL (DRAPES) ×2 IMPLANT
DRSG MEPILEX SACRM 8.7X9.8 (GAUZE/BANDAGES/DRESSINGS) ×1 IMPLANT
DRSG OPSITE POSTOP 4X10 (GAUZE/BANDAGES/DRESSINGS) ×1 IMPLANT
ELECT CAUTERY BLADE 6.4 (BLADE) ×1 IMPLANT
GAUZE 4X4 16PLY ~~LOC~~+RFID DBL (SPONGE) ×1 IMPLANT
GAUZE XEROFORM 1X8 LF (GAUZE/BANDAGES/DRESSINGS) ×1 IMPLANT
GLOVE BIO SURGEON STRL SZ7.5 (GLOVE) ×4 IMPLANT
GLOVE BIO SURGEON STRL SZ8 (GLOVE) ×4 IMPLANT
GLOVE BIOGEL PI IND STRL 8 (GLOVE) ×1 IMPLANT
GLOVE SURG UNDER LTX SZ8 (GLOVE) ×1 IMPLANT
GOWN STRL REUS W/ TWL LRG LVL3 (GOWN DISPOSABLE) ×1 IMPLANT
GOWN STRL REUS W/ TWL XL LVL3 (GOWN DISPOSABLE) ×1 IMPLANT
GOWN STRL REUS W/TWL LRG LVL3 (GOWN DISPOSABLE) ×1
GOWN STRL REUS W/TWL XL LVL3 (GOWN DISPOSABLE) ×1
HEAD CERAMIC BIOLOX 32 TP1 -3 (Head) IMPLANT
HIP SHELL ACETAB 3H 48MM C (Hips) ×1 IMPLANT
HOLSTER ELECTROSUGICAL PENCIL (MISCELLANEOUS) ×1 IMPLANT
HOOD PEEL AWAY FLYTE STAYCOOL (MISCELLANEOUS) ×3 IMPLANT
IV NS IRRIG 3000ML ARTHROMATIC (IV SOLUTION) ×1 IMPLANT
KIT TURNOVER KIT A (KITS) ×1 IMPLANT
LINER ACE G7 32 SZC HIGH WALL (Liner) IMPLANT
MANIFOLD NEPTUNE II (INSTRUMENTS) ×1 IMPLANT
MAT ABSORB  FLUID 56X50 GRAY (MISCELLANEOUS) ×1
MAT ABSORB FLUID 56X50 GRAY (MISCELLANEOUS) ×1 IMPLANT
NDL FILTER BLUNT 18X1 1/2 (NEEDLE) ×1 IMPLANT
NDL SAFETY ECLIP 18X1.5 (MISCELLANEOUS) ×2 IMPLANT
NDL SPNL 20GX3.5 QUINCKE YW (NEEDLE) ×1 IMPLANT
NEEDLE FILTER BLUNT 18X 1/2SAF (NEEDLE) ×1
NEEDLE FILTER BLUNT 18X1 1/2 (NEEDLE) ×1 IMPLANT
NEEDLE SPNL 20GX3.5 QUINCKE YW (NEEDLE) ×1 IMPLANT
PACK HIP PROSTHESIS (MISCELLANEOUS) ×1 IMPLANT
PENCIL SMOKE EVACUATOR (MISCELLANEOUS) ×1 IMPLANT
PIN STEIN SMOOTH 3/16X9 (Pin) IMPLANT
PIN STEIN SMOOTH 4.8X9 (Pin) IMPLANT
PIN STEINMAN 3/16 (PIN) ×1 IMPLANT
PRESSURIZER CEMENT PROX FEM SM (MISCELLANEOUS) IMPLANT
PULSAVAC PLUS IRRIG FAN TIP (DISPOSABLE) ×1
RESTRICTOR CEMENT PE SZ 2 (Cement) IMPLANT
SHELL ACETAB HIP 3H 48MM C (Hips) IMPLANT
SPONGE T-LAP 18X18 ~~LOC~~+RFID (SPONGE) ×5 IMPLANT
STAPLER SKIN PROX 35W (STAPLE) ×1 IMPLANT
STEM CENTRALIZER DISTAL SZ9 (Orthopedic Implant) IMPLANT
STEM HIP FEMORAL 9MMX130MM (Stem) IMPLANT
SUT TICRON 2-0 30IN 311381 (SUTURE) ×3 IMPLANT
SUT VIC AB 0 CT1 36 (SUTURE) ×1 IMPLANT
SUT VIC AB 1 CT1 36 (SUTURE) ×1 IMPLANT
SUT VIC AB 2-0 CT1 (SUTURE) ×3 IMPLANT
SYR 10ML LL (SYRINGE) ×1 IMPLANT
SYR 20ML LL LF (SYRINGE) ×1 IMPLANT
SYR 30ML LL (SYRINGE) ×2 IMPLANT
TAPE TRANSPORE STRL 2 31045 (GAUZE/BANDAGES/DRESSINGS) ×1 IMPLANT
TIP FAN IRRIG PULSAVAC PLUS (DISPOSABLE) ×1 IMPLANT
TOWER CARTRIDGE SMART MIX (DISPOSABLE) IMPLANT
TRAP FLUID SMOKE EVACUATOR (MISCELLANEOUS) ×2 IMPLANT
WATER STERILE IRR 1000ML POUR (IV SOLUTION) ×1 IMPLANT
WATER STERILE IRR 500ML POUR (IV SOLUTION) ×1 IMPLANT

## 2022-01-09 NOTE — Anesthesia Procedure Notes (Addendum)
Spinal  Patient location during procedure: OR Start time: 01/09/2022 1:15 PM End time: 01/09/2022 1:21 PM Reason for block: surgical anesthesia Staffing Performed: resident/CRNA  Resident/CRNA: Cammie Sickle, CRNA Other anesthesia staff: Lenord Fellers, RN Performed by: Cammie Sickle, CRNA Authorized by: Iran Ouch, MD   Preanesthetic Checklist Completed: patient identified, IV checked, site marked, risks and benefits discussed, surgical consent, monitors and equipment checked, pre-op evaluation and timeout performed Spinal Block Patient position: sitting Prep: ChloraPrep Patient monitoring: continuous pulse ox and blood pressure Approach: midline Location: L4-5 Injection technique: single-shot Needle Needle type: Pencan  Needle gauge: 24 G Needle length: 10 cm Assessment Sensory level: T6 Events: CSF return Additional Notes Negative paresthesia, negative for blood

## 2022-01-09 NOTE — Op Note (Signed)
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  Post-Op Diagnosis:   Same.  Procedure:   Left total hip arthroplasty.  Surgeon:   Pascal Lux, MD  Assistant:   Cameron Proud, PA-C  Anesthesia:   Spinal  Findings:   As above.  Complications:   None  EBL:   200 cc  Fluids:   500 cc crystalloid  UOP:   None  TT:   None  Drains:   None  Closure:   Staples  Implants:   Biomet hybrid system with a #9 laterally offset cemented Echo fracture stem, a 48 mm acetabular shell with an E-poly hi-wall liner, and a 32 mm ceramic head with a -3 mm neck.  Brief Clinical Note:   The patient is an 88 year old female who is now 21 months status post an in situ cannulated screw fixation of an impacted left femoral neck fracture. The patient went on to heal the fracture, but developed avascular necrosis resulting in progressive collapse of the femoral head with lateral migration of the screws. The cannulated screws were removed in February, 2023, but she developed progressively worsening pain secondary to femoral head collapse with progressive degenerative joint disease. She presents at this time for a left total hip arthroplasty.  Procedure:   The patient was brought into the operating room. After adequate spinal anesthesia was obtained, the patient was repositioned in the right lateral decubitus position and secured using a lateral hip positioner. The left hip and lower extremity were prepped with ChloroPrep solution before being draped sterilely. Preoperative antibiotics were administered. A timeout was performed to verify the appropriate surgical site.    A standard posterior approach to the hip was made through an approximately 6-7 inch incision. The incision was carried down through the subcutaneous tissues to expose the gluteal  fascia and proximal end of the iliotibial band. These structures were split the length of the incision and the Charnley self-retaining hip retractor placed. The bursal tissues were swept posteriorly to expose the short external rotators. The anterior border of the piriformis tendon was identified and this plane developed down through the capsule to enter the joint. A flap of tissue was elevated off the posterior aspect of the femoral neck and greater trochanter and retracted posteriorly. This flap included the piriformis tendon, the short external rotators, and the posterior capsule. The soft tissues were elevated off the lateral aspect of the ilium before the left hip was dislocated.   The piriformis fossa was debrided of soft tissues before the intramedullary canal was accessed through this point using a triple step reamer. The canal was reamed sequentially beginning with a #7 tapered reamer and progressing to a #11 tapered reamer. This provided excellent circumferential chatter. Using the appropriate guide, a femoral neck cut was made 10-12 mm above the lesser trochanter. The femoral head was removed.  Attention was directed to the acetabular side. The labrum was debrided circumferentially before the ligamentum teres was removed using a large curette. A line was drawn on the drapes corresponding to the native version of the acetabulum. This line was used as a guide while the acetabulum was reamed sequentially beginning with a 43 mm reamer and progressing to a 47 mm reamer. This provided excellent circumferential chatter. The 47 mm trial acetabulum was positioned and found to fit quite well. Therefore, the  48 mm acetabular shell was selected and impacted into place with care taken to maintain the appropriate version. The trial high wall liner was inserted.  Attention was redirected to the femoral side. A box osteotome was used to establish version before the canal was broached sequentially beginning with a  #7 broach and progressing to a #11 broach. This was left in place and several trial reductions performed using both the standard and laterally offset neck options, as well as the -6 mm, -3 mm, and +0 mm neck lengths. After removing the trial components, the "manhole cover" was placed into the apex of the acetabular shell and tightened securely. The permanent E-polyethylene hi-wall liner was impacted into the acetabular shell and its locking mechanism verified using a quarter-inch osteotome.   The femoral canal was sounded and a #2 distal cement restrictor selected and inserted to the appropriate depth.  The femoral canal was prepared for cementing by irrigating it thoroughly with the jet lavage irrigation system using the bottle brush attachment to help remove any loose cancellous bone fragments.  The canal was packed with a Neo-Synephrine soaked Vag sponge while the cement was being mixed on the back table.  When the cement was ready, the cement was introduced into the femoral canal, tilting the canal up to let any residual bloody fluid drained out.  The cement was pressurized before the #9 laterally offset Echo fracture stem was impacted into place with care taken to maintain the appropriate version.  The excess cement was removed using a Soil scientist before the cement was allowed to harden.    A repeat trial reduction was performed using the -3 mm and +0 mm neck lengths. The -3 mm neck length demonstrated excellent stability both in extension and external rotation as well as with flexion to 90 and internal rotation beyond 70. It also was stable in the position of sleep. In addition, leg lengths appeared to be restored appropriately. The 32 mm ceramic head with the -3 mm neck was impacted onto the stem of the femoral component. The Morse taper locking mechanism was verified using manual distraction before the head was relocated and placed through a range of motion with the findings as described  above.  The wound was copiously irrigated with sterile saline solution via the jet lavage system before the peri-incisional and pericapsular tissues were injected with a solution comprised of 30 cc of 0.5% Sensorcaine with epinephrine, 20 cc of Exparel, 2 cc of Kenalog 40 (80 mg), and 15 mg of Toradol diluted out to 90 cc with normal saline to help with postoperative analgesia. The posterior flap was reapproximated to the posterior aspect of the greater trochanter using #2 Tycron interrupted sutures placed through drill holes. Several additional #2 Tycron interrupted sutures were used to reinforce this layer of closure. The iliotibial band was reapproximated using #1 Vicryl interrupted sutures before the gluteal fascia was closed using a running #1 Vicryl suture. At this point, 1 g of transexemic acid in 10 cc of normal saline was injected into the joint to help reduce postoperative bleeding. The subcutaneous tissues were closed in several layers using 2-0 Vicryl interrupted sutures before the skin was closed using staples. A sterile occlusive dressing was applied to the wound. The patient was then rolled back into the supine position on her hospital bed before being awakened and returned to the recovery room in satisfactory condition after tolerating the procedure well.

## 2022-01-09 NOTE — H&P (Signed)
History of Present Illness:  Cynthia Ritter is a 88 y.o. female who presents for follow-up now 6 months status post removal of the painful retained cannulated screws from her left hip following an in situ cannulated screw fixation of a valgus impacted left femoral neck fracture. The patient notes that she has been having increased pain in her hip which she rates at 7/10 on today's visit. Her symptoms are aggravated by any prolonged standing or ambulation. She also has pain even when trying to turn over in bed. She localizes the pain to the left groin area and notes that will radiate down her thigh to her knee, occasionally radiating even below her knee. She also gets occasional radiation of pain into the left buttock region. She has been ambulating with a cane with limited benefit. She is unable to reciprocate stairs. She has been applying diclofenac gel and taking diclofenac tablets as necessary with limited benefit. She also has been applying ice without benefit. She has tried to attend physical therapy as well as do exercises on her own at home, also with limited benefit. She is ready to consider more aggressive treatment options.  Current Outpatient Medications: acetaminophen (TYLENOL) 650 MG ER tablet Take 650 mg by mouth every 8 (eight) hours as needed for Pain  alendronate (FOSAMAX) 70 MG tablet Take 1 tablet (70 mg total) by mouth every 7 (seven) days Take with a full glass of water. Do not lie down for the next 30 min. 12 tablet 1  aspirin-sod bicarb-citric acid (ALKA-SELTZER) 324 mg TbEF tablet Take by mouth as needed  cyanocobalamin (VITAMIN B12) 1000 MCG tablet Take 1,000 mcg by mouth once daily  diclofenac (VOLTAREN) 1 % topical gel Apply topically 4 (four) times daily as needed 100 g 5  diclofenac (VOLTAREN) 50 MG EC tablet Take 1 tablet (50 mg total) by mouth 2 (two) times daily with meals for 180 days 180 tablet 1  FLUoxetine (PROZAC) 10 MG capsule Take 1 capsule (10 mg total) by mouth once  daily 90 capsule 1  FLUoxetine (PROZAC) 20 MG capsule Take 1 capsule (20 mg total) by mouth once daily 90 capsule 1  losartan (COZAAR) 25 MG tablet Take 1 tablet (25 mg total) by mouth once daily 90 tablet 1  mirabegron (MYRBETRIQ) 25 mg ER Tablet Take 1 tablet (25 mg total) by mouth once daily for 180 days 90 tablet 1  tolterodine (DETROL LA) 4 MG LA capsule Take 1 capsule (4 mg total) by mouth once daily 90 capsule 1   Allergies: No Known Allergies  Past Medical History:  Allergic rhinitis  Arthritis  Bilateral hand numbness 03/08/2020  Carpal tunnel syndrome on both sides 02/10/2020  Cataract cortical, senile  Closed fracture of left hip with routine healing 04/16/2020  left femoral neck fracture, s/p pinning  Colon polyps  COVID-19 11/10/2020  Depression  Essential hypertension 09/14/2017  Femoral neck fracture (CMS-HCC) 04/16/2020  Hyperlipidemia  Insomnia  Lipoma of left lower extremity 02/10/2020  Osteopenia 06/12/2020  FRAX: 17% 10 year risk of hip fracture. 30% 10 year risk of any major fracture.  Primary osteoarthritis of right knee 01/28/2017  Primary osteoarthritis of right knee 01/28/2017  Squamous cell cancer of multiple sites of skin of upper arm (Followed by Dr. Phillip Heal)  Trigger index finger of right hand 02/10/2020  Urinary incontinence, mixed   Past Surgical History:  CHOLECYSTECTOMY 1991  ENDOMETRIAL BIOPSY 2003 - Negative.  CATARACT EXTRACTION Bilateral 2017 w/ Dr. Wallace Going  BIOPSY SKIN FACE 2018 (+  Squamous cell of nose)  EXTRACTION TEETH 12/2017  Cannulated Hip Pinning Left 04/18/2020 s/p Lt hip fx from mechanical fall  Endoscopic right carpal tunnel release 06/13/2020 (Dr. Roland Rack)  Removal of painful retained screws, left hip 06/25/2021 (Dr. Roland Rack)  [place] (Multiple biopsies)  COLONOSCOPY x 2   Family History:  Liver disease Sister  Non-alcoholic  Ulcers Sister  Heart failure Mother  Heart failure Father   Social History:    Socioeconomic History:  Marital status: Married  Spouse name: Reynolds Bowl"  Number of children: 2  Years of education: 19  Occupational History  Occupation: Retired Pharmacist, hospital  Tobacco Use  Smoking status: Former  Types: Cigarettes  Smokeless tobacco: Never  Tobacco comments:  smoked in Tax adviser Use: Never used  Substance and Sexual Activity  Alcohol use: Yes  Comment: very little. Wine or Liquor.  Drug use: No  Sexual activity: Not Currently  Partners: Male  Birth control/protection: Post-menopausal  Social History Narrative  Son, Legrand Como, is living w/ them now.   Review of Systems:  A comprehensive 14 point ROS was performed, reviewed, and the pertinent orthopaedic findings are documented in the HPI.  Physical Exam: Vitals:  12/25/21 1016  BP: 132/78  Weight: 80 kg (176 lb 6.4 oz)  Height: 160 cm ('5\' 3"'$ )  PainSc: 7  PainLoc: Hip   General/Constitutional: The patient appears to be well-nourished, well-developed, and in no acute distress. Neuro/Psych: Normal mood and affect, oriented to person, place and time. Eyes: Non-icteric. Pupils are equal, round, and reactive to light, and exhibit synchronous movement. ENT: Unremarkable. Lymphatic: No palpable adenopathy. Respiratory: Lungs clear to auscultation, Normal chest excursion, No wheezes, and Non-labored breathing Cardiovascular: Regular rate and rhythm. No murmurs. and No edema, swelling or tenderness, except as noted in detailed exam. Integumentary: No impressive skin lesions present, except as noted in detailed exam. Musculoskeletal: Unremarkable, except as noted in detailed exam.  Left hip exam: The patient's gait is not assessed on today's visit as she presents in a wheelchair. On examination, her surgical incision remains well-healed and without evidence for infection. No swelling, erythema, ecchymosis, abrasions, or other skin abnormalities are identified. She experiences mild-moderate  tenderness to palpation over the lateral aspect of the left hip, as well as mild tenderness to palpation more posteriorly. Her left leg appears to be approximately 2.5 to 3 cm shorter than her right based on assessing femoral length in the sitting position. Her left hip can be flexed to 95 degrees and extended to 10 degrees with mild pain in flexion more so than extension. She lacks 10 degrees of internal rotation and can tolerate 20 degrees of external rotation with moderate pain with internal and external rotation. She is neurovascularly intact to the left lower extremity and foot.  Imaging:  X-rays of the pelvis and left hip are obtained. These films demonstrate severe degenerative joint disease of the left hip with collapse of the femoral head and significant shortening. No lytic lesions or new acute bony abnormalities are identified.  Assessment: 1. Post-traumatic osteoarthritis of left hip  2.  Status post closed intracapsular fracture of left femur.   Plan: The treatment options were discussed with the patient. In addition, patient educational materials were provided regarding the diagnosis and treatment options. The patient is quite frustrated by her symptoms and functional limitations, and is ready to consider more aggressive treatment options. Therefore, I have recommended a surgical procedure, specifically a left total hip arthroplasty. The procedure was  discussed with the patient, as were the potential risks (including bleeding, infection, nerve and/or blood vessel injury, persistent or recurrent pain, loosening and/or failure of the components, dislocation, leg length inequality, need for further surgery, blood clots, strokes, heart attacks and/or arhythmias, pneumonia, etc.) and benefits. The patient states his/her understanding and wishes to proceed. All of the patient's questions and concerns were answered. She can call any time with further concerns. She will follow up post-surgery,  routine.    H&P reviewed and patient re-examined. No changes.

## 2022-01-09 NOTE — Transfer of Care (Signed)
Immediate Anesthesia Transfer of Care Note  Patient: Cynthia Ritter  Procedure(s) Performed: TOTAL HIP ARTHROPLASTY (Left: Hip)  Patient Location: PACU  Anesthesia Type:Spinal  Level of Consciousness: drowsy  Airway & Oxygen Therapy: Patient Spontanous Breathing and Patient connected to face mask oxygen  Post-op Assessment: Report given to RN and Post -op Vital signs reviewed and stable  Post vital signs: Reviewed and stable  Last Vitals:  Vitals Value Taken Time  BP 102/59 01/09/22 1557  Temp 36.1 C 01/09/22 1556  Pulse 71 01/09/22 1559  Resp 18 01/09/22 1559  SpO2 100 % 01/09/22 1559  Vitals shown include unvalidated device data.  Last Pain:  Vitals:   01/09/22 1226  TempSrc: Temporal  PainSc: 0-No pain         Complications: No notable events documented.

## 2022-01-09 NOTE — Anesthesia Preprocedure Evaluation (Addendum)
Anesthesia Evaluation  Patient identified by MRN, date of birth, ID band Patient awake    Reviewed: Allergy & Precautions, NPO status , Patient's Chart, lab work & pertinent test results  History of Anesthesia Complications Negative for: history of anesthetic complications  Airway Mallampati: I  TM Distance: >3 FB Neck ROM: Full    Dental  (+) Edentulous Upper, Edentulous Lower, Dental Advidsory Given   Pulmonary neg pulmonary ROS, neg shortness of breath, neg sleep apnea, neg COPD, neg recent URI, Patient abstained from smoking.Not current smoker, former smoker,    Pulmonary exam normal breath sounds clear to auscultation       Cardiovascular Exercise Tolerance: Poor METShypertension, (-) angina(-) CAD and (-) Past MI Normal cardiovascular exam(-) dysrhythmias  Rhythm:Regular Rate:Normal     Neuro/Psych PSYCHIATRIC DISORDERS Depression negative neurological ROS     GI/Hepatic negative GI ROS, Neg liver ROS, neg GERD  ,  Endo/Other  negative endocrine ROSneg diabetes  Renal/GU negative Renal ROS Bladder dysfunction      Musculoskeletal  (+) Arthritis ,   Abdominal   Peds negative pediatric ROS (+)  Hematology negative hematology ROS (+)   Anesthesia Other Findings Past Medical History: No date: Arthritis     Comment:  left heel/ knees and ankle, wears a knee brace               occasionally No date: Depression No date: Hot flashes No date: Hypertension No date: Incontinence     Comment:  URINARY No date: Motion sickness     Comment:  planes, back seat-cars No date: Seasonal allergies  Reproductive/Obstetrics negative OB ROS                           Anesthesia Physical  Anesthesia Plan  ASA: 2  Anesthesia Plan: Spinal   Post-op Pain Management: Regional block*, Tylenol PO (pre-op)* and Toradol IV (intra-op)*   Induction: Intravenous  PONV Risk Score and Plan: 3 and  Ondansetron and Dexamethasone  Airway Management Planned: Natural Airway  Additional Equipment: None  Intra-op Plan:   Post-operative Plan:   Informed Consent: I have reviewed the patients History and Physical, chart, labs and discussed the procedure including the risks, benefits and alternatives for the proposed anesthesia with the patient or authorized representative who has indicated his/her understanding and acceptance.     Dental advisory given  Plan Discussed with: CRNA  Anesthesia Plan Comments: (Discussed risks of anesthesia with patient, including PONV, sore throat, lip/dental damage. Rare risks discussed as well, such as cardiorespiratory and neurological sequelae. Patient understands.)      Anesthesia Quick Evaluation

## 2022-01-10 DIAGNOSIS — M1612 Unilateral primary osteoarthritis, left hip: Secondary | ICD-10-CM | POA: Diagnosis not present

## 2022-01-10 LAB — BASIC METABOLIC PANEL
Anion gap: 4 — ABNORMAL LOW (ref 5–15)
BUN: 19 mg/dL (ref 8–23)
CO2: 22 mmol/L (ref 22–32)
Calcium: 8.1 mg/dL — ABNORMAL LOW (ref 8.9–10.3)
Chloride: 109 mmol/L (ref 98–111)
Creatinine, Ser: 0.95 mg/dL (ref 0.44–1.00)
GFR, Estimated: 58 mL/min — ABNORMAL LOW (ref >60)
Glucose, Bld: 144 mg/dL — ABNORMAL HIGH (ref 70–99)
Potassium: 4 mmol/L (ref 3.5–5.1)
Sodium: 135 mmol/L (ref 135–145)

## 2022-01-10 LAB — CBC
HCT: 32.1 % — ABNORMAL LOW (ref 36.0–46.0)
Hemoglobin: 10.6 g/dL — ABNORMAL LOW (ref 12.0–15.0)
MCH: 31.3 pg (ref 26.0–34.0)
MCHC: 33 g/dL (ref 30.0–36.0)
MCV: 94.7 fL (ref 80.0–100.0)
Platelets: 238 10*3/uL (ref 150–400)
RBC: 3.39 MIL/uL — ABNORMAL LOW (ref 3.87–5.11)
RDW: 13.1 % (ref 11.5–15.5)
WBC: 11.4 10*3/uL — ABNORMAL HIGH (ref 4.0–10.5)
nRBC: 0 % (ref 0.0–0.2)

## 2022-01-10 MED ORDER — APIXABAN 2.5 MG PO TABS: 2.5000 mg | ORAL_TABLET | Freq: Two times a day (BID) | ORAL | 0 refills | Status: DC

## 2022-01-10 MED ORDER — ONDANSETRON HCL 4 MG PO TABS
4.0000 mg | ORAL_TABLET | Freq: Four times a day (QID) | ORAL | 0 refills | Status: DC | PRN
Start: 2022-01-10 — End: 2022-07-03

## 2022-01-10 MED ORDER — LOSARTAN POTASSIUM 25 MG PO TABS: 25.0000 mg | ORAL_TABLET | Freq: Every day | ORAL | Status: DC

## 2022-01-10 MED ORDER — HYDROCODONE-ACETAMINOPHEN 5-325 MG PO TABS: 1.0000 | ORAL_TABLET | Freq: Four times a day (QID) | ORAL | 0 refills | Status: DC | PRN

## 2022-01-10 NOTE — Discharge Summary (Signed)
Physician Discharge Summary  Patient ID: Cynthia Ritter MRN: [id number] DOB/AGE: 08-03-1932 88 y.o.  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  Discharge Diagnoses: Patient Active Problem List   Diagnosis Date Noted   Status post total hip replacement, 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   Femoral neck fracture ([place]) 04/16/2020   Hypertension    Depression    Incontinence     Past Medical History:  Diagnosis Date   Arthritis    left heel/ knees and ankle, wears a knee brace occasionally   COVID-19 11/20/2020   Depression    Femoral neck fracture ([facility])    s/p repair   GERD (gastroesophageal reflux disease)    Hot flashes    Hyperlipemia    Hypertension    Incontinence    URINARY   Motion sickness    planes, back seat-cars   Osteopenia    Seasonal allergies    Skin cancer      Transfusion: None.   Consultants (if any):   Discharged Condition: Improved  Hospital Course: DESIRE FULP is an 88 y.o. female who was admitted 01/09/2022 with a diagnosis of  degenerative joint disease secondary to avascular necrosis following in situ cannulated screw fixation of impacted left femoral neck fracture and went to the operating room on 01/09/2022 and underwent the above named procedures.    Surgeries: Procedure(s): TOTAL HIP ARTHROPLASTY on 01/09/2022 Patient tolerated the surgery well. Taken to PACU where she was stabilized and then transferred to the orthopedic floor.  Started on Eliquis 2.'5mg'$  every 12 hours. Heels elevated on bed with rolled towels. No evidence of DVT. Negative Homan. Physical therapy started on day #1 for gait training and transfer. OT started day #1 for ADL and assisted devices.  Patient's IV was removed on POD1.  Implants: Biomet hybrid system with a #9  laterally offset cemented Echo fracture stem, a 48 mm acetabular shell with an E-poly hi-wall liner, and a 32 mm ceramic head with a -3 mm neck.  She was given perioperative antibiotics:  Anti-infectives (From admission, onward)    Start     Dose/Rate Route Frequency Ordered Stop   01/09/22 1900  ceFAZolin (ANCEF) IVPB 2g/100 mL premix        2 g 200 mL/hr over 30 Minutes Intravenous Every 6 hours 01/09/22 1745 01/10/22 0638   01/09/22 1222  ceFAZolin (ANCEF) 2-4 GM/100ML-% IVPB       Note to Pharmacy: Jeanene Erb E: cabinet override      01/09/22 1222 01/09/22 1350   01/09/22 1215  ceFAZolin (ANCEF) IVPB 2g/100 mL premix        2 g 200 mL/hr over 30 Minutes Intravenous On call to O.R. 01/09/22 1200 01/09/22 1345     .  She was given sequential compression devices, early ambulation, and Eliquis for DVT prophylaxis.  She benefited maximally from the hospital stay and there were no complications.    Recent vital signs:  Vitals:   01/10/22 0728 01/10/22 1111  BP: (!) 144/60   Pulse: 63   Resp: 16   Temp: 98.3 F (36.8 C)   SpO2: 98% 99%   Recent laboratory studies:  Lab Results  Component Value Date   HGB 10.6 (L) 01/10/2022   HGB 13.1 01/09/2022   HGB 12.8 01/03/2022   Lab Results  Component Value Date  WBC 11.4 (H) 01/10/2022   PLT 238 01/10/2022   Lab Results  Component Value Date   INR 1.0 04/16/2020   Lab Results  Component Value Date   NA 135 01/10/2022   K 4.0 01/10/2022   CL 109 01/10/2022   CO2 22 01/10/2022   BUN 19 01/10/2022   CREATININE 0.95 01/10/2022   GLUCOSE 144 (H) 01/10/2022   Discharge Medications:   Allergies as of 01/10/2022   No Known Allergies      Medication List     STOP taking these medications    acetaminophen 500 MG tablet Commonly known as: TYLENOL   diclofenac 50 MG EC tablet Commonly known as: VOLTAREN       TAKE these medications    alendronate 70 MG tablet Commonly known as: FOSAMAX Take 70 mg by  mouth once a week.   apixaban 2.5 MG Tabs tablet Commonly known as: ELIQUIS Take 1 tablet (2.5 mg total) by mouth 2 (two) times daily.   aspirin-sod bicarb-citric acid 325 MG Tbef tablet Commonly known as: ALKA-SELTZER Take 325 mg by mouth every 6 (six) hours as needed (indigestion).   cyanocobalamin 1000 MCG tablet Commonly known as: VITAMIN B12 Take 1,000 mcg by mouth every other day.   famotidine 20 MG tablet Commonly known as: PEPCID Take 20 mg by mouth daily as needed for heartburn or indigestion.   FLUoxetine 20 MG capsule Commonly known as: PROZAC Take 20 mg by mouth daily.   FLUoxetine 10 MG capsule Commonly known as: PROZAC Take 10 mg by mouth in the morning.   HYDROcodone-acetaminophen 5-325 MG tablet Commonly known as: NORCO/VICODIN Take 1-2 tablets by mouth every 6 (six) hours as needed for moderate pain (pain score 4-6). What changed:  how much to take reasons to take this   losartan 25 MG tablet Commonly known as: COZAAR Take 25 mg by mouth every morning.   Myrbetriq 25 MG Tb24 tablet Generic drug: mirabegron ER Take 25 mg by mouth every morning.   ondansetron 4 MG tablet Commonly known as: ZOFRAN Take 1 tablet (4 mg total) by mouth every 6 (six) hours as needed for nausea.   oxymetazoline 0.05 % nasal spray Commonly known as: AFRIN Place 1 spray into both nostrils at bedtime as needed for congestion.   tolterodine 4 MG 24 hr capsule Commonly known as: DETROL LA Take 4 mg by mouth every morning.               Durable Medical Equipment  (From admission, onward)           Start     Ordered   01/09/22 1745  DME Bedside commode  Once       Question:  Patient needs a bedside commode to treat with the following condition  Answer:  Status post total hip replacement, left   01/09/22 1745   01/09/22 1745  DME 3 n 1  Once        01/09/22 1745   01/09/22 1745  DME Walker rolling  Once       Question Answer Comment  Walker: With 5 Inch  Wheels   Patient needs a walker to treat with the following condition Status post total hip replacement, left      01/09/22 1745           Diagnostic Studies: DG HIP UNILAT W OR W/O PELVIS 2-3 VIEWS LEFT  Result Date: 01/09/2022 CLINICAL DATA:  S/p total hip replacement EXAM: DG HIP (WITH OR  WITHOUT PELVIS) 2-3V LEFT COMPARISON:  Left hip radiographs 04/16/2020, left hip intraoperative fluoroscopy 06/25/2021 and 04/18/2020 FINDINGS: Interval total left hip arthroplasty. No perihardware lucency is seen to indicate hardware failure or loosening. Expected postoperative changes including left hip joint effusion and lateral left hip and proximal thigh subcutaneous air. No acute fracture or dislocation. Mild pubic symphysis osteoarthritis. IMPRESSION: Interval total left hip arthroplasty without evidence of hardware failure. Electronically Signed   By: Yvonne Kendall M.D.   On: 01/09/2022 16:13    Disposition: Discharge home with [facility]     Lattie Corns, PA-C Follow up in 14 day(s).   Specialty: Physician Assistant Why: Electa Sniff information: [address] [place] [address] [phone]                Signed: Judson Roch PA-C 01/10/2022, 12:25 PM

## 2022-01-10 NOTE — Plan of Care (Signed)
  Problem: Clinical Measurements: Goal: Ability to maintain clinical measurements within normal limits will improve Outcome: Progressing   Problem: Activity: Goal: Risk for activity intolerance will decrease Outcome: Progressing   Problem: Nutrition: Goal: Adequate nutrition will be maintained Outcome: Progressing   Problem: Coping: Goal: Level of anxiety will decrease Outcome: Progressing   Problem: Safety: Goal: Ability to remain free from injury will improve Outcome: Progressing

## 2022-01-10 NOTE — Progress Notes (Signed)
Discharge instructions reviewed with patient including followup visits and new medications/medication changes.  Understanding was verbalized and all questions were answered.  IV removed without complication; patient tolerated well.  Patient awaiting ride.

## 2022-01-10 NOTE — Evaluation (Signed)
Occupational Therapy Evaluation Patient Details Name: Cynthia Ritter MRN: [id number] DOB: [birth year] Today's Date: 01/10/2022   History of Present Illness Pt is 48 YOF admitted for L hip THA. PMH includes: post traumatic OA L hip s/p closed intracapuslaur fx of L femoral neck, AVN, CTS, cataracts, HTN, HLD, osteopenia, OA, trigger finger, depression, incontinence.   Clinical Impression   Cynthia Ritter was seen for OT evaluation this date. Prior to hospital admission, pt was NOD I for mobility and ADLs. Pt lives with spouse. Pt presents to acute OT demonstrating impaired ADL performance and functional mobility 2/2 decreased activity tolerance and functional strength/ROM/balance deficits. Pt currently requires MOD I don R sock, MAX A don L sock. SUPERVISION toilet t/f and pericare standing. Pt educated on posterior hip pcns and adaptive dressing strategies, no acute OT needs, will sign off. Upon hospital discharge, recommend no OT follow up.   Recommendations for follow up therapy are one component of a multi-disciplinary discharge planning process, led by the attending physician.  Recommendations may be updated based on patient status, additional functional criteria and insurance authorization.   Follow Up Recommendations  No OT follow up    Assistance Recommended at Discharge Intermittent Supervision/Assistance  Patient can return home with the following A little help with walking and/or transfers;A little help with bathing/dressing/bathroom    Functional Status Assessment  Patient has had a recent decline in their functional status and demonstrates the ability to make significant improvements in function in a reasonable and predictable amount of time.  Equipment Recommendations  None recommended by OT    Recommendations for Other Services       Precautions / Restrictions Precautions Precautions: Posterior Hip;Fall Precaution Booklet Issued: Yes (comment) Restrictions Weight Bearing  Restrictions: Yes LLE Weight Bearing: Weight bearing as tolerated      Mobility Bed Mobility Overal bed mobility: Needs Assistance Bed Mobility: Supine to Sit     Supine to sit: Min guard          Transfers Overall transfer level: Needs assistance Equipment used: Rolling walker (2 wheels) Transfers: Sit to/from Stand Sit to Stand: Min guard                  Balance Overall balance assessment: Needs assistance Sitting-balance support: No upper extremity supported, Feet supported Sitting balance-Leahy Scale: Good     Standing balance support: No upper extremity supported, During functional activity Standing balance-Leahy Scale: Fair                             ADL either performed or assessed with clinical judgement   ADL Overall ADL's : Needs assistance/impaired                                       General ADL Comments: MOD I don R sock, MAX A don L sock. SUPERVISION toilet t/f and pericare standing.      Pertinent Vitals/Pain Pain Assessment Pain Assessment: No/denies pain     Hand Dominance     Extremity/Trunk Assessment Upper Extremity Assessment Upper Extremity Assessment: Overall WFL for tasks assessed   Lower Extremity Assessment Lower Extremity Assessment: Defer to PT evaluation LLE Deficits / Details: impaired d/t surgical status   Cervical / Trunk Assessment Cervical / Trunk Assessment: Kyphotic   Communication Communication Communication: No difficulties   Cognition Arousal/Alertness: Awake/alert Behavior During Therapy:  WFL for tasks assessed/performed Overall Cognitive Status: Within Functional Limits for tasks assessed                                 General Comments: very chatty and easily distracted      Home Living Family/patient expects to be discharged to:: Private residence Living Arrangements: Spouse/significant other;Children Available Help at Discharge: Available 24  hours/day Type of Home: House Home Access: Level entry     Home Layout: Two level;Able to live on main level with bedroom/bathroom Alternate Level Stairs-Number of Steps: split level home, 3 stairs to [place] main living area, has B railings. Alternate Level Stairs-Rails: Right;Left;Can reach both           Home Equipment: Rolling Walker (2 wheels);Standard Walker;Cane - quad;Cane - single point;BSC/3in1          Prior Functioning/Environment Prior Level of Function : Independent/Modified Independent             Mobility Comments: full community ambulator w AD ADLs Comments: I w ADLs/IADLs        OT Problem List: Decreased strength;Impaired balance (sitting and/or standing);Decreased range of motion         OT Goals(Current goals can be found in the care plan section) Acute Rehab OT Goals Patient Stated Goal: to go home OT Goal Formulation: With patient Time For Goal Achievement: 01/24/22 Potential to Achieve Goals: Good ADL Goals Pt Will Perform Grooming: Independently;standing   AM-PAC OT "6 Clicks" Daily Activity     Outcome Measure Help from another person eating meals?: None Help from another person taking care of personal grooming?: None Help from another person toileting, which includes using toliet, bedpan, or urinal?: A Little Help from another person bathing (including washing, rinsing, drying)?: A Little Help from another person to put on and taking off regular upper body clothing?: None Help from another person to put on and taking off regular lower body clothing?: A Lot 6 Click Score: 20   End of Session Nurse Communication: Mobility status  Activity Tolerance: Patient tolerated treatment well Patient left: in chair;with call bell/phone within reach;with nursing/sitter in room  OT Visit Diagnosis: Other abnormalities of gait and mobility (R26.89);Muscle weakness (generalized) (M62.81)                Time: 5686-1683 OT Time Calculation (min): 26  min Charges:  OT General Charges $OT Visit: 1 Visit OT Evaluation $OT Eval Moderate Complexity: 1 Mod OT Treatments $Self Care/Home Management : 8-22 mins  Dessie Coma, M.S. OTR/L  01/10/22, 12:26 PM  ascom [phone]

## 2022-01-10 NOTE — Evaluation (Signed)
Physical Therapy Evaluation Patient Details Name: Cynthia Ritter MRN: [id number] DOB: [birth year] Today's Date: 01/10/2022  History of Present Illness  Pt is 63 YOF admitted for L hip THA. PMH includes: post traumatic OA L hip s/p closed intracapuslaur fx of L femoral neck, AVN, CTS, cataracts, HTN, HLD, osteopenia, OA, trigger finger, depression, incontinence.  Clinical Impression  Pt presents to PT in recliner and agreeable to participate in therapy services.  Pt was pleasant and motivated to participate during the session and put forth good effort throughout. Pt amb well, w no  LOB or physical assistance required, but w decr cadence and required occasional cueing to avoid letting RW advance too far in front of her and minimal cueing to amb w wider BOS for fall prevention techniques. Would benefit from skilled HHPT to address above deficits in strength, functional mobility, balance, gait mechanics, and activity tolerance to promote optimal return to PLOF.      Recommendations for follow up therapy are one component of a multi-disciplinary discharge planning process, led by the attending physician.  Recommendations may be updated based on patient status, additional functional criteria and insurance authorization.  Follow Up Recommendations Home health PT      Assistance Recommended at Discharge Intermittent Supervision/Assistance  Patient can return home with the following  A little help with walking and/or transfers;A little help with bathing/dressing/bathroom;Assistance with cooking/housework;Assist for transportation    Equipment Recommendations None recommended by PT  Recommendations for Other Services       Functional Status Assessment Patient has had a recent decline in their functional status and demonstrates the ability to make significant improvements in function in a reasonable and predictable amount of time.     Precautions / Restrictions Precautions Precautions: Posterior  Hip;Fall Precaution Booklet Issued: Yes (comment) Restrictions Weight Bearing Restrictions: Yes LLE Weight Bearing: Weight bearing as tolerated      Mobility  Bed Mobility               General bed mobility comments: in recliner upon start and end of session    Transfers Overall transfer level: Needs assistance Equipment used: Rolling walker (2 wheels) Transfers: Sit to/from Stand Sit to Stand: Min guard           General transfer comment: extra time and effort but no physcial assistance required  Min cueing to maintain post hip precautions  Ambulation/Gait Ambulation/Gait assistance: Min guard Gait Distance (Feet): 100 Feet Assistive device: Rolling walker (2 wheels) Gait Pattern/deviations: Step-to pattern, Step-through pattern, Decreased stance time - left, Decreased step length - right, Decreased weight shift to left, Narrow base of support Gait velocity: decr     General Gait Details: decr cadence, required some cueing to stay inside the walker during amb and to avoid having too narrow BOS, no LOB, no physical assistance required  Stairs            Wheelchair Mobility    Modified Rankin (Stroke Patients Only)       Balance Overall balance assessment: Needs assistance Sitting-balance support: No upper extremity supported, Feet supported Sitting balance-Leahy Scale: Good     Standing balance support: Bilateral upper extremity supported, During functional activity, Reliant on assistive device for balance Standing balance-Leahy Scale: Fair                               Pertinent Vitals/Pain Pain Assessment Pain Assessment: No/denies pain    Home Living Family/patient  expects to be discharged to:: Private residence Living Arrangements: Spouse/significant other;Children Available Help at Discharge: Available 24 hours/day Type of Home: House Home Access: Level entry     Alternate Level Stairs-Number of Steps: split level home,  3 stairs to [place] main living area, has B railings. Home Layout: Two level;Able to live on main level with bedroom/bathroom Home Equipment: Standard Walker;Cane - single point;BSC/3in1;Cane - quad      Prior Function Prior Level of Function : Independent/Modified Independent             Mobility Comments: full community ambulator w AD ADLs Comments: I w ADLs/IADLs     Hand Dominance        Extremity/Trunk Assessment   Upper Extremity Assessment Upper Extremity Assessment: Overall WFL for tasks assessed    Lower Extremity Assessment Lower Extremity Assessment: LLE deficits/detail LLE Deficits / Details: impaired d/t surgical status    Cervical / Trunk Assessment Cervical / Trunk Assessment: Kyphotic  Communication   Communication: No difficulties  Cognition Arousal/Alertness: Awake/alert Behavior During Therapy: WFL for tasks assessed/performed Overall Cognitive Status: Within Functional Limits for tasks assessed                                 General Comments: very chatty and easily distracted        General Comments      Exercises Total Joint Exercises Ankle Circles/Pumps: AROM, Both, 10 reps Quad Sets: Strengthening, Both, 10 reps Gluteal Sets: Strengthening, Both, 10 reps Other Exercises Other Exercises: post hip precautions edu 90 degree L turn training to avoid L hip IR 3x   Assessment/Plan    PT Assessment Patient needs continued PT services  PT Problem List Decreased strength;Decreased range of motion;Decreased activity tolerance;Decreased balance;Decreased mobility;Decreased knowledge of precautions       PT Treatment Interventions DME instruction;Therapeutic activities;Gait training;Therapeutic exercise;Patient/family education;Stair training;Balance training;Functional mobility training;Neuromuscular re-education    PT Goals (Current goals can be found in the Care Plan section)  Acute Rehab PT Goals Patient Stated Goal: to  walk again PT Goal Formulation: With patient Time For Goal Achievement: 01/23/22 Potential to Achieve Goals: Good    Frequency BID     Co-evaluation               AM-PAC PT "6 Clicks" Mobility  Outcome Measure Help needed turning from your back to your side while in a flat bed without using bedrails?: None Help needed moving from lying on your back to sitting on the side of a flat bed without using bedrails?: None Help needed moving to and from a bed to a chair (including a wheelchair)?: A Little Help needed standing up from a chair using your arms (e.g., wheelchair or bedside chair)?: A Little Help needed to walk in hospital room?: A Little Help needed climbing 3-5 steps with a railing? : A Lot 6 Click Score: 19    End of Session Equipment Utilized During Treatment: Gait belt Activity Tolerance: Patient tolerated treatment well Patient left: in chair;with call bell/phone within reach;with chair alarm set;with SCD's reapplied Nurse Communication: Mobility status PT Visit Diagnosis: Other abnormalities of gait and mobility (R26.89);Muscle weakness (generalized) (M62.81)    Time: 2836-6294 PT Time Calculation (min) (ACUTE ONLY): 42 min   Charges:              Glenice Laine MPH, SPT 01/10/22, 11:22 AM

## 2022-01-10 NOTE — Progress Notes (Signed)
Physical Therapy Treatment Patient Details Name: Cynthia Ritter MRN: [id number] DOB: 12/10/1932 Today's Date: 01/10/2022   History of Present Illness Pt is 89 YOF admitted for L hip THA. PMH includes: post traumatic OA L hip s/p closed intracapuslaur fx of L femoral neck, AVN, CTS, cataracts, HTN, HLD, osteopenia, OA, trigger finger, depression, incontinence.    PT Comments    Pt presents to PT in recliner and agreeable to participate in therapy services. Pt was pleasant and motivated to participate during the session and put forth good effort throughout. Pt amb well, w no LOB or physical assistance required. Did require frequent cueing during L turns to maintain posterior hip precautions. Able to negotiate stairs w no LOB and no physical assistance required with occasional cueing. Would benefit from skilled HHPT to address above deficits in strength, functional mobility, activity tolerance, and gait mechanics to promote optimal return to PLOF.    Recommendations for follow up therapy are one component of a multi-disciplinary discharge planning process, led by the attending physician.  Recommendations may be updated based on patient status, additional functional criteria and insurance authorization.  Follow Up Recommendations  Home health PT     Assistance Recommended at Discharge Intermittent Supervision/Assistance  Patient can return home with the following A little help with walking and/or transfers;A little help with bathing/dressing/bathroom;Assistance with cooking/housework;Assist for transportation   Equipment Recommendations  None recommended by PT    Recommendations for Other Services       Precautions / Restrictions Precautions Precautions: Posterior Hip;Fall Precaution Booklet Issued: Yes (comment) Restrictions Weight Bearing Restrictions: Yes LLE Weight Bearing: Weight bearing as tolerated     Mobility  Bed Mobility Overal bed mobility: Needs Assistance Bed Mobility:  Sit to Supine     Supine to sit: Supervision     General bed mobility comments: in recliner upon start and end of session    Transfers Overall transfer level: Needs assistance Equipment used: Rolling walker (2 wheels) Transfers: Sit to/from Stand Sit to Stand: Min guard           General transfer comment: extra time and effort but no physcial assistance required, cueing to maintain post hip precautions    Ambulation/Gait Ambulation/Gait assistance: Min guard Gait Distance (Feet): 200 Feet, w 1 seated rest break Assistive device: Rolling walker (2 wheels) Gait Pattern/deviations: Step-to pattern, Step-through pattern, Decreased stance time - left, Decreased step length - right, Decreased weight shift to left, Narrow base of support, Antalgic Gait velocity: decr     General Gait Details: decr cadence, required some cueing to stay inside the walker during amb and to avoid having too narrow BOS, no LOB, no physical assistance required   Stairs  Yes 4 stairs x2, "up w good, down w surgical" technique No LOB or physical assistance required, using B handrails           Wheelchair Mobility    Modified Rankin (Stroke Patients Only)       Balance Overall balance assessment: Needs assistance Sitting-balance support: No upper extremity supported, Feet supported Sitting balance-Leahy Scale: Good     Standing balance support: No upper extremity supported, During functional activity, Reliant on assistive device for balance Standing balance-Leahy Scale: Fair                              Cognition Arousal/Alertness: Awake/alert Behavior During Therapy: WFL for tasks assessed/performed Overall Cognitive Status: Within Functional Limits for tasks assessed  General Comments: very chatty and easily distracted        Exercises  Other Exercises: post hip precautions edu, 90 deg L turn technique practice, car  transfer edu    General Comments        Pertinent Vitals/Pain Pain Assessment Pain Assessment: 0-10 Pain Score: 5  Pain Location: L hip Pain Descriptors / Indicators: Aching Pain Intervention(s): Limited activity within patient's tolerance, Monitored during session, Repositioned    Home Living                          Prior Function            PT Goals (current goals can now be found in the care plan section) Acute Rehab PT Goals Patient Stated Goal: to walk again PT Goal Formulation: With patient Time For Goal Achievement: 01/23/22 Potential to Achieve Goals: Good    Frequency    BID      PT Plan      Co-evaluation              AM-PAC PT "6 Clicks" Mobility   Outcome Measure  Help needed turning from your back to your side while in a flat bed without using bedrails?: None Help needed moving from lying on your back to sitting on the side of a flat bed without using bedrails?: None Help needed moving to and from a bed to a chair (including a wheelchair)?: A Little Help needed standing up from a chair using your arms (e.g., wheelchair or bedside chair)?: A Little Help needed to walk in hospital room?: A Little Help needed climbing 3-5 steps with a railing? : A Little 6 Click Score: 20    End of Session Equipment Utilized During Treatment: Gait belt Activity Tolerance: Patient tolerated treatment well Patient left: in bed;with call bell/phone within reach;with bed alarm set;with SCD's reapplied Nurse Communication: Mobility status PT Visit Diagnosis: Other abnormalities of gait and mobility (R26.89);Muscle weakness (generalized) (M62.81)     Time: 3358-2518 PT Time Calculation (min) (ACUTE ONLY): 48 min  Charges:                       Glenice Laine MPH, SPT 01/10/22, 3:52 PM

## 2022-01-10 NOTE — Progress Notes (Addendum)
  Subjective: 1 Day Post-Op Procedure(s) (LRB): TOTAL HIP ARTHROPLASTY (Left) Patient reports pain as mild.   Patient is well, and has had no acute complaints or problems Plan is to go Home after hospital stay. Negative for chest pain and shortness of breath Fever: no Gastrointestinal:Negative for nausea and vomiting Reports no abdominal pain this morning.  Objective: Vital signs in last 24 hours: Temp:  [97 F (36.1 C)-98.3 F (36.8 C)] 98.3 F (36.8 C) (09/08 0728) Pulse Rate:  [58-71] 63 (09/08 0728) Resp:  [11-20] 16 (09/08 0728) BP: (87-156)/(57-87) 144/60 (09/08 0728) SpO2:  [93 %-100 %] 98 % (09/08 0728) Weight:  [81.6 kg] 81.6 kg (09/07 1226)  Intake/Output from previous day:  Intake/Output Summary (Last 24 hours) at 01/10/2022 0737 Last data filed at 01/10/2022 0322 Gross per 24 hour  Intake 1145.21 ml  Output 200 ml  Net 945.21 ml    Intake/Output this shift: No intake/output data recorded.  Labs: Recent Labs    01/09/22 1221 01/10/22 0537  HGB 13.1 10.6*   Recent Labs    01/09/22 1221 01/10/22 0537  WBC 7.5 11.4*  RBC 4.18 3.39*  HCT 41.0 32.1*  PLT 277 238   Recent Labs    01/09/22 1221 01/10/22 0537  NA 141 135  K 3.9 4.0  CL 108 109  CO2 26 22  BUN 16 19  CREATININE 0.93 0.95  GLUCOSE 94 144*  CALCIUM 9.1 8.1*   No results for input(s): "LABPT", "INR" in the last 72 hours.   EXAM General - Patient is Alert, Appropriate, and Oriented Extremity - ABD soft Neurovascular intact Dorsiflexion/Plantar flexion intact Incision: dressing C/D/I No cellulitis present Compartment soft Dressing/Incision - Honeycomb dressing intact without any drainage. Motor Function - intact, moving foot and toes well on exam.  Abdomen soft with intact bowel sounds this morning.  Past Medical History:  Diagnosis Date   Arthritis    left heel/ knees and ankle, wears a knee brace occasionally   COVID-19 11/20/2020   Depression    Femoral neck fracture  (HCC)    s/p repair   GERD (gastroesophageal reflux disease)    Hot flashes    Hyperlipemia    Hypertension    Incontinence    URINARY   Motion sickness    planes, back seat-cars   Osteopenia    Seasonal allergies    Skin cancer     Assessment/Plan: 1 Day Post-Op Procedure(s) (LRB): TOTAL HIP ARTHROPLASTY (Left) Principal Problem:   Status post total hip replacement, left  Estimated body mass index is 30.88 kg/m as calculated from the following:   Height as of this encounter: '5\' 4"'$  (1.626 m).   Weight as of this encounter: 81.6 kg. Advance diet Up with therapy D/C IV fluids when tolerating po intake.  Labs reviewed this AM, WBC 11.4, BP 144/60, HR 63. Patient with readings of hypotension up until most recent BP reading, will hold Losartan for now.  Will add if patient remains hypertensive this morning. Up with therapy today.  Begin working on a BM. Possible d/c home today pending progress with therapy.  DVT Prophylaxis - TED hose and Eliquis Weight-Bearing as tolerated to left leg  J. Cameron Proud, PA-C [facility] Orthopaedic Surgery 01/10/2022, 7:37 AM

## 2022-01-10 NOTE — TOC Initial Note (Addendum)
Transition of Care [facility]) - Initial/Assessment Note    Patient Details  Name: Cynthia Ritter MRN: [id number] Date of Birth: 30-May-1932  [facility] CM/SW Contact:    Magnus Ivan, LCSW Phone Number: 01/10/2022, 10:49 AM  Clinical Narrative:                 CSW spoke with patient regarding DC planning. Patient was prearranged by Surgeon's Office with [facility]. Patient is aware and agreeable. Confirmed home address. Patient stated she lives with her husband and son who will provide transportation at DC. Family is supportive. PCP is Gaetano Net, NP. Pharmacy is [facility], also gets some medications through her [facility]. Patient has 3 walkers, 2 canes, a 3in1, and a tub bench at home.  Patient denies additional needs at this time.   2:08- Notified [place] with [facility] that patient has orders to DC home today.  Expected Discharge Plan: [place] Barriers to Discharge: Continued Medical Work up   Patient Goals and CMS Choice Patient states their goals for this hospitalization and ongoing recovery are:: home with home health CMS Medicare.gov Compare Post Acute Care list provided to:: Patient Choice offered to / list presented to : Patient  Expected Discharge Plan and Services Expected Discharge Plan: [place]       Living arrangements for the past 2 months: Single Family Home                           HH Arranged: PT [place] Agency: [place]        Prior Living Arrangements/Services Living arrangements for the past 2 months: Single Family Home Lives with:: Adult Children, Spouse Patient language and need for interpreter reviewed:: Yes Do you feel safe going back to the place where you live?: Yes      Need for Family Participation in Patient Care: Yes (Comment) Care giver support system in place?: Yes (comment) Current home services: DME Criminal Activity/Legal Involvement Pertinent to Current  Situation/Hospitalization: No - Comment as needed  Activities of Daily Living Home Assistive Devices/Equipment: Cane (specify quad or straight), Dentures (specify type), Walker (specify type) ADL Screening (condition at time of admission) Patient's cognitive ability adequate to safely complete daily activities?: Yes Is the patient deaf or have difficulty hearing?: No Does the patient have difficulty seeing, even when wearing glasses/contacts?: No Does the patient have difficulty concentrating, remembering, or making decisions?: Yes Patient able to express need for assistance with ADLs?: Yes Does the patient have difficulty dressing or bathing?: No Independently performs ADLs?: Yes (appropriate for developmental age) Does the patient have difficulty walking or climbing stairs?: Yes Weakness of Legs: Left Weakness of Arms/Hands: None  Permission Sought/Granted Permission sought to share information with : Facility Sport and exercise psychologist, Family Supports Permission granted to share information with : Yes, Verbal Permission Granted     Permission granted to share info w AGENCY: [place] agencies  Permission granted to share info w Relationship: son, spouse     Emotional Assessment       Orientation: : Oriented to Self, Oriented to Place, Oriented to  Time, Oriented to Situation Alcohol / Substance Use: Not Applicable Psych Involvement: No (comment)  Admission diagnosis:  Status post total hip replacement, left [S85.462] Patient Active Problem List   Diagnosis Date Noted   Status post total hip replacement, 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   Femoral neck fracture ([place]) 04/16/2020   Hypertension  Depression    Incontinence    PCP:  Sallee Lange, NP Pharmacy:   [facility] [place], [place] [facility] [address] AT [place] [facility] [place] [address] Phone: [phone] Fax: [phone]     Social Determinants of Health (SDOH) Interventions     Readmission Risk Interventions     No data to display

## 2022-01-10 NOTE — Anesthesia Postprocedure Evaluation (Signed)
Anesthesia Post Note  Patient: Cynthia Ritter  Procedure(s) Performed: TOTAL HIP ARTHROPLASTY (Left: Hip)  Patient location during evaluation: Nursing Unit Anesthesia Type: Spinal Level of consciousness: oriented and awake and alert Pain management: pain level controlled Vital Signs Assessment: post-procedure vital signs reviewed and stable Respiratory status: spontaneous breathing and respiratory function stable Cardiovascular status: blood pressure returned to baseline and stable Postop Assessment: no headache, no backache, no apparent nausea or vomiting and patient able to bend at knees Anesthetic complications: no   No notable events documented.   Last Vitals:  Vitals:   01/10/22 0322 01/10/22 0728  BP: 101/60 (!) 144/60  Pulse: 67 63  Resp: 16 16  Temp: (!) 36.4 C 36.8 C  SpO2: 98% 98%    Last Pain:  Vitals:   01/09/22 2246  TempSrc:   PainSc: 0-No pain                 Laurelin Elson B Clarisa Kindred

## 2022-01-10 NOTE — Discharge Instructions (Signed)
Instructions after Total Hip Replacement     J. Jeffrey Poggi, M.D.  J. Lance Hiliana Eilts, PA-C     Dept. of Orthopaedics & Sports Medicine  [facility]  [address]  [place], [place]  [address]  Phone: [phone]   Fax: [phone]    DIET: Drink plenty of non-alcoholic fluids. Resume your normal diet. Include foods high in fiber.  ACTIVITY:  You may use crutches or a walker with weight-bearing as tolerated, unless instructed otherwise. You may be weaned off of the walker or crutches by your Physical Therapist.  Do NOT reach below the level of your knees or cross your legs until allowed.    Continue doing gentle exercises. Exercising will reduce the pain and swelling, increase motion, and prevent muscle weakness.   Please continue to use the TED compression stockings for 6 weeks. You may remove the stockings at night, but should reapply them in the morning. Do not drive or operate any equipment until instructed.  WOUND CARE:  Continue to use ice packs periodically to reduce pain and swelling. Keep the incision clean and dry. You may bathe or shower after the staples are removed at the first office visit following surgery.  MEDICATIONS: You may resume your regular medications. Please take the pain medication as prescribed on the medication. Do not take pain medication on an empty stomach. You have been given a prescription for a blood thinner to prevent blood clots. Please take the medication as instructed. (NOTE: After completing a 2 week course of Eliquis, take one Enteric-coated aspirin once a day.) Pain medications and iron supplements can cause constipation. Use a stool softener (Senokot or Colace) on a daily basis and a laxative (dulcolax or miralax) as needed. Do not drive or drink alcoholic beverages when taking pain medications.  CALL THE OFFICE FOR: Temperature above 101 degrees Excessive bleeding or drainage on the dressing. Excessive swelling, coldness, or  paleness of the toes. Persistent nausea and vomiting.  FOLLOW-UP:  You should have an appointment to return to the office in 2 weeks after surgery. Arrangements have been made for continuation of Physical Therapy (either home therapy or outpatient therapy).  

## 2022-01-13 ENCOUNTER — Encounter: Payer: Self-pay | Admitting: Surgery

## 2022-01-13 LAB — SURGICAL PATHOLOGY

## 2022-06-26 ENCOUNTER — Other Ambulatory Visit: Payer: Self-pay | Admitting: Surgery

## 2022-07-01 ENCOUNTER — Inpatient Hospital Stay: Admission: RE | Admit: 2022-07-01 | Payer: Medicare Other | Source: Ambulatory Visit

## 2022-07-03 ENCOUNTER — Encounter
Admission: RE | Admit: 2022-07-03 | Discharge: 2022-07-03 | Disposition: A | Discharge status: Home / Self Care | Payer: Medicare Other | Source: Ambulatory Visit | Attending: Surgery | Admitting: Surgery

## 2022-07-03 VITALS — Ht 64.0 in | Wt 182.0 lb

## 2022-07-03 DIAGNOSIS — I10 Essential (primary) hypertension: Secondary | ICD-10-CM

## 2022-07-03 DIAGNOSIS — Z01812 Encounter for preprocedural laboratory examination: Secondary | ICD-10-CM

## 2022-07-03 HISTORY — DX: Cardiac murmur, unspecified: R01.1

## 2022-07-03 HISTORY — DX: Unilateral primary osteoarthritis, right knee: M17.11

## 2022-07-03 HISTORY — DX: Benign lipomatous neoplasm of skin and subcutaneous tissue of left leg: D17.24

## 2022-07-03 HISTORY — DX: Cortical age-related cataract, unspecified eye: H25.019

## 2022-07-03 HISTORY — DX: Carpal tunnel syndrome, unspecified upper limb: G56.00

## 2022-07-03 NOTE — Patient Instructions (Addendum)
Your procedure is scheduled on: Wednesday, March 6 Report to the Registration Desk on the 1st floor of the [facility]. To find out your arrival time, please call [phone] between 1PM - 3PM on: Tuesday, March 5 If your arrival time is 6:00 am, do not arrive before that time as the [place] entrance doors do not open until 6:00 am.  REMEMBER: Instructions that are not followed completely may result in serious medical risk, up to and including death; or upon the discretion of your surgeon and anesthesiologist your surgery may need to be rescheduled.  Do not eat food after midnight the night before surgery.  No gum chewing or hard candies.  You may however, drink CLEAR liquids up to 2 hours before you are scheduled to arrive for your surgery. Do not drink anything within 2 hours of your scheduled arrival time.  Clear liquids include: - water  - apple juice without pulp - gatorade (not RED colors) - black coffee or tea (Do NOT add milk or creamers to the coffee or tea) Do NOT drink anything that is not on this list.  In addition, your doctor has ordered for you to drink the provided:  Ensure Pre-Surgery Clear Carbohydrate Drink  Drinking this carbohydrate drink up to two hours before surgery helps to reduce insulin resistance and improve patient outcomes. Please complete drinking 2 hours before scheduled arrival time.  One week prior to surgery: starting today, February 29 Stop Anti-inflammatories (NSAIDS) such as Advil, Aleve, Ibuprofen, Motrin, Naproxen, Naprosyn and Aspirin based products such as Excedrin, Goody's Powder, BC Powder. Stop ANY OVER THE COUNTER supplements until after surgery. You may however, continue to take Tylenol if needed for pain up until the day of surgery.  Continue taking all prescribed medications   TAKE ONLY THESE MEDICATIONS THE MORNING OF SURGERY WITH A SIP OF WATER:  Famotidine (Pepcid) - (take one the night before and one on the morning of  surgery - helps to prevent nausea after surgery.) Fluoxetine (Prozac) Myrbetriq Tolterodine (Detrol)  No Alcohol for 24 hours before or after surgery.  No Smoking including e-cigarettes for 24 hours before surgery.  No chewable tobacco products for at least 6 hours before surgery.  No nicotine patches on the day of surgery.  Do not use any "recreational" drugs for at least a week (preferably 2 weeks) before your surgery.  Please be advised that the combination of cocaine and anesthesia may have negative outcomes, up to and including death. If you test positive for cocaine, your surgery will be cancelled.  On the morning of surgery brush your teeth with toothpaste and water, you may rinse your mouth with mouthwash if you wish. Do not swallow any toothpaste or mouthwash.  Use CHG Soap as directed on instruction sheet.  Do not wear jewelry, make-up, hairpins, clips or nail polish.  Do not wear lotions, powders, or perfumes.   Do not shave body hair from the neck down 48 hours before surgery.  Contact lenses, hearing aids and dentures may not be worn into surgery.  Do not bring valuables to the hospital. [facility] is not responsible for any missing/lost belongings or valuables.   Notify your doctor if there is any change in your medical condition (cold, fever, infection).  Wear comfortable clothing (specific to your surgery type) to the hospital.  After surgery, you can help prevent lung complications by doing breathing exercises.  Take deep breaths and cough every 1-2 hours. Your doctor may order a device  called an Chiropodist to help you take deep breaths.  If you are being discharged the day of surgery, you will not be allowed to drive home. You will need a responsible individual to drive you home and stay with you for 24 hours after surgery.   If you are taking public transportation, you will need to have a responsible individual with you.  Please call the  [place] Dept. at [phone] if you have any questions about these instructions.  Surgery Visitation Policy:  Patients undergoing a surgery or procedure may have two family members or support persons with them as long as the person is not COVID-19 positive or experiencing its symptoms.      Preparing for Surgery with CHLORHEXIDINE GLUCONATE (CHG) Soap  Chlorhexidine Gluconate (CHG) Soap  o An antiseptic cleaner that kills germs and bonds with the skin to continue killing germs even after washing  o Used for showering the night before surgery and morning of surgery  Before surgery, you can play an important role by reducing the number of germs on your skin.  CHG (Chlorhexidine gluconate) soap is an antiseptic cleanser which kills germs and bonds with the skin to continue killing germs even after washing.  Please do not use if you have an allergy to CHG or antibacterial soaps. If your skin becomes reddened/irritated stop using the CHG.  1. Shower the NIGHT BEFORE SURGERY and the MORNING OF SURGERY with CHG soap.  2. If you choose to wash your hair, wash your hair first as usual with your normal shampoo.  3. After shampooing, rinse your hair and body thoroughly to remove the shampoo.  4. Use CHG as you would any other liquid soap. You can apply CHG directly to the skin and wash gently with a scrungie or a clean washcloth.  5. Apply the CHG soap to your body only from the neck down. Do not use on open wounds or open sores. Avoid contact with your eyes, ears, mouth, and genitals (private parts). Wash face and genitals (private parts) with your normal soap.  6. Wash thoroughly, paying special attention to the area where your surgery will be performed.  7. Thoroughly rinse your body with warm water.  8. Do not shower/wash with your normal soap after using and rinsing off the CHG soap.  9. Pat yourself dry with a clean towel.  10. Wear clean pajamas to bed the night  before surgery.  12. Place clean sheets on your bed the night of your first shower and do not sleep with pets.  13. Shower again with the CHG soap on the day of surgery prior to arriving at the hospital.  14. Do not apply any deodorants/lotions/powders.  15. Please wear clean clothes to the hospital.

## 2022-07-04 ENCOUNTER — Encounter
Admission: RE | Admit: 2022-07-04 | Discharge: 2022-07-04 | Disposition: A | Discharge status: Home / Self Care | Payer: Medicare Other | Source: Ambulatory Visit | Attending: Surgery | Admitting: Surgery

## 2022-07-04 DIAGNOSIS — I10 Essential (primary) hypertension: Secondary | ICD-10-CM | POA: Diagnosis not present

## 2022-07-04 DIAGNOSIS — Z01812 Encounter for preprocedural laboratory examination: Secondary | ICD-10-CM

## 2022-07-04 DIAGNOSIS — Z01818 Encounter for other preprocedural examination: Secondary | ICD-10-CM | POA: Insufficient documentation

## 2022-07-04 LAB — CBC
HCT: 40.9 % (ref 36.0–46.0)
Hemoglobin: 13 g/dL (ref 12.0–15.0)
MCH: 29.7 pg (ref 26.0–34.0)
MCHC: 31.8 g/dL (ref 30.0–36.0)
MCV: 93.6 fL (ref 80.0–100.0)
Platelets: 274 10*3/uL (ref 150–400)
RBC: 4.37 MIL/uL (ref 3.87–5.11)
RDW: 14.5 % (ref 11.5–15.5)
WBC: 6.8 10*3/uL (ref 4.0–10.5)
nRBC: 0 % (ref 0.0–0.2)

## 2022-07-09 ENCOUNTER — Ambulatory Visit: Payer: Medicare Other | Admitting: Urgent Care

## 2022-07-09 ENCOUNTER — Other Ambulatory Visit: Payer: Self-pay

## 2022-07-09 ENCOUNTER — Encounter: Payer: Self-pay | Admitting: Surgery

## 2022-07-09 ENCOUNTER — Ambulatory Visit
Admission: RE | Admit: 2022-07-09 | Discharge: 2022-07-09 | Disposition: A | Discharge status: Home / Self Care | Payer: Medicare Other | Attending: Surgery | Admitting: Surgery

## 2022-07-09 ENCOUNTER — Ambulatory Visit: Payer: Medicare Other | Admitting: Certified Registered"

## 2022-07-09 ENCOUNTER — Encounter
Admission: RE | Disposition: A | Discharge status: Home / Self Care | Payer: Self-pay | Source: Home / Self Care | Attending: Surgery

## 2022-07-09 DIAGNOSIS — K219 Gastro-esophageal reflux disease without esophagitis: Secondary | ICD-10-CM | POA: Insufficient documentation

## 2022-07-09 DIAGNOSIS — Z87891 Personal history of nicotine dependence: Secondary | ICD-10-CM | POA: Insufficient documentation

## 2022-07-09 DIAGNOSIS — Z79899 Other long term (current) drug therapy: Secondary | ICD-10-CM | POA: Diagnosis not present

## 2022-07-09 DIAGNOSIS — Z09 Encounter for follow-up examination after completed treatment for conditions other than malignant neoplasm: Secondary | ICD-10-CM | POA: Diagnosis not present

## 2022-07-09 DIAGNOSIS — F32A Depression, unspecified: Secondary | ICD-10-CM | POA: Diagnosis not present

## 2022-07-09 DIAGNOSIS — I10 Essential (primary) hypertension: Secondary | ICD-10-CM | POA: Diagnosis not present

## 2022-07-09 DIAGNOSIS — G5602 Carpal tunnel syndrome, left upper limb: Secondary | ICD-10-CM | POA: Diagnosis present

## 2022-07-09 HISTORY — PX: CARPAL TUNNEL RELEASE: SHX101

## 2022-07-09 SURGERY — RELEASE, CARPAL TUNNEL, ENDOSCOPIC
Anesthesia: General | Site: Wrist | Laterality: Left

## 2022-07-09 MED ORDER — METOCLOPRAMIDE HCL 10 MG PO TABS: 5.0000 mg | ORAL_TABLET | Freq: Three times a day (TID) | ORAL | Status: DC | PRN

## 2022-07-09 MED ORDER — SODIUM CHLORIDE 0.9 % IV SOLN: INTRAVENOUS | Status: DC

## 2022-07-09 MED ORDER — ACETAMINOPHEN 325 MG PO TABS: 325.0000 mg | ORAL_TABLET | Freq: Four times a day (QID) | ORAL | Status: DC | PRN

## 2022-07-09 MED ORDER — BUPIVACAINE HCL (PF) 0.5 % IJ SOLN
INTRAMUSCULAR | Status: DC | PRN
  Administered 2022-07-09: 10 mL

## 2022-07-09 MED ORDER — EPHEDRINE SULFATE (PRESSORS) 50 MG/ML IJ SOLN
INTRAMUSCULAR | Status: DC | PRN
  Administered 2022-07-09 (×3): 5 mg via INTRAVENOUS

## 2022-07-09 MED ORDER — 0.9 % SODIUM CHLORIDE (POUR BTL) OPTIME
TOPICAL | Status: DC | PRN
  Administered 2022-07-09: 500 mL

## 2022-07-09 MED ORDER — FENTANYL CITRATE (PF) 100 MCG/2ML IJ SOLN
INTRAMUSCULAR | Status: DC | PRN
  Administered 2022-07-09 (×2): 25 ug via INTRAVENOUS

## 2022-07-09 MED ORDER — LIDOCAINE HCL (CARDIAC) PF 100 MG/5ML IV SOSY
PREFILLED_SYRINGE | INTRAVENOUS | Status: DC | PRN
  Administered 2022-07-09: 30 mg via INTRAVENOUS

## 2022-07-09 MED ORDER — FENTANYL CITRATE (PF) 100 MCG/2ML IJ SOLN: 25.0000 ug | INTRAMUSCULAR | Status: DC | PRN

## 2022-07-09 MED ORDER — CHLORHEXIDINE GLUCONATE 0.12 % MT SOLN: 15.0000 mL | Freq: Once | OROMUCOSAL | Status: AC

## 2022-07-09 MED ORDER — FENTANYL CITRATE (PF) 100 MCG/2ML IJ SOLN
INTRAMUSCULAR | Status: AC
  Filled 2022-07-09: qty 2

## 2022-07-09 MED ORDER — CHLORHEXIDINE GLUCONATE 0.12 % MT SOLN
OROMUCOSAL | Status: AC
  Administered 2022-07-09: 15 mL via OROMUCOSAL
  Filled 2022-07-09: qty 15

## 2022-07-09 MED ORDER — ONDANSETRON HCL 4 MG PO TABS: 4.0000 mg | ORAL_TABLET | Freq: Four times a day (QID) | ORAL | Status: DC | PRN

## 2022-07-09 MED ORDER — PROPOFOL 10 MG/ML IV BOLUS
INTRAVENOUS | Status: DC | PRN
  Administered 2022-07-09: 20 mg via INTRAVENOUS

## 2022-07-09 MED ORDER — ONDANSETRON HCL 4 MG/2ML IJ SOLN
INTRAMUSCULAR | Status: DC | PRN
  Administered 2022-07-09: 4 mg via INTRAVENOUS

## 2022-07-09 MED ORDER — CEFAZOLIN SODIUM-DEXTROSE 2-4 GM/100ML-% IV SOLN
INTRAVENOUS | Status: AC
  Filled 2022-07-09: qty 100

## 2022-07-09 MED ORDER — PROPOFOL 500 MG/50ML IV EMUL
INTRAVENOUS | Status: DC | PRN
  Administered 2022-07-09: 100 ug/kg/min via INTRAVENOUS

## 2022-07-09 MED ORDER — ONDANSETRON HCL 4 MG/2ML IJ SOLN: 4.0000 mg | Freq: Four times a day (QID) | INTRAMUSCULAR | Status: DC | PRN

## 2022-07-09 MED ORDER — ORAL CARE MOUTH RINSE: 15.0000 mL | Freq: Once | OROMUCOSAL | Status: AC

## 2022-07-09 MED ORDER — METOCLOPRAMIDE HCL 5 MG/ML IJ SOLN: 5.0000 mg | Freq: Three times a day (TID) | INTRAMUSCULAR | Status: DC | PRN

## 2022-07-09 MED ORDER — LACTATED RINGERS IV SOLN: INTRAVENOUS | Status: DC

## 2022-07-09 MED ORDER — CEFAZOLIN SODIUM-DEXTROSE 2-4 GM/100ML-% IV SOLN
2.0000 g | INTRAVENOUS | Status: AC
  Administered 2022-07-09: 2 g via INTRAVENOUS

## 2022-07-09 MED ORDER — BUPIVACAINE HCL (PF) 0.5 % IJ SOLN
INTRAMUSCULAR | Status: AC
  Filled 2022-07-09: qty 30

## 2022-07-09 MED ORDER — ONDANSETRON HCL 4 MG/2ML IJ SOLN: 4.0000 mg | Freq: Once | INTRAMUSCULAR | Status: DC | PRN

## 2022-07-09 SURGICAL SUPPLY — 35 items
APL PRP STRL LF DISP 70% ISPRP (MISCELLANEOUS) ×1
BNDG CMPR 5X4 CHSV STRCH STRL (GAUZE/BANDAGES/DRESSINGS) ×1
BNDG COHESIVE 4X5 TAN STRL LF (GAUZE/BANDAGES/DRESSINGS) ×1 IMPLANT
BNDG ELASTIC 2X5.8 VLCR STR LF (GAUZE/BANDAGES/DRESSINGS) ×1 IMPLANT
BNDG ESMARCH 4 X 12 STRL LF (GAUZE/BANDAGES/DRESSINGS) ×1
BNDG ESMARCH 4X12 STRL LF (GAUZE/BANDAGES/DRESSINGS) ×1 IMPLANT
CHLORAPREP W/TINT 26 (MISCELLANEOUS) ×1 IMPLANT
CORD BIP STRL DISP 12FT (MISCELLANEOUS) ×1 IMPLANT
CUFF TOURN SGL QUICK 18X4 (TOURNIQUET CUFF) ×1 IMPLANT
DRAPE SURG 17X11 SM STRL (DRAPES) ×1 IMPLANT
FORCEPS JEWEL BIP 4-3/4 STR (INSTRUMENTS) ×1 IMPLANT
GAUZE SPONGE 4X4 12PLY STRL (GAUZE/BANDAGES/DRESSINGS) ×1 IMPLANT
GAUZE XEROFORM 1X8 LF (GAUZE/BANDAGES/DRESSINGS) ×1 IMPLANT
GLOVE BIO SURGEON STRL SZ8 (GLOVE) ×1 IMPLANT
GLOVE SURG UNDER LTX SZ8 (GLOVE) ×1 IMPLANT
GOWN STRL REUS W/ TWL LRG LVL3 (GOWN DISPOSABLE) ×1 IMPLANT
GOWN STRL REUS W/ TWL XL LVL3 (GOWN DISPOSABLE) ×1 IMPLANT
GOWN STRL REUS W/TWL LRG LVL3 (GOWN DISPOSABLE) ×1
GOWN STRL REUS W/TWL XL LVL3 (GOWN DISPOSABLE) ×1
KIT CARPAL TUNNEL (MISCELLANEOUS) ×1
KIT ESCP INSRT D SLOT CANN KN (MISCELLANEOUS) ×1 IMPLANT
KIT TURNOVER KIT A (KITS) ×1 IMPLANT
MANIFOLD NEPTUNE II (INSTRUMENTS) ×1 IMPLANT
NS IRRIG 500ML POUR BTL (IV SOLUTION) ×1 IMPLANT
PACK EXTREMITY ARMC (MISCELLANEOUS) ×1 IMPLANT
SPLINT WRIST LG LT TX990309 (SOFTGOODS) IMPLANT
SPLINT WRIST LG RT TX900304 (SOFTGOODS) IMPLANT
SPLINT WRIST M LT TX990308 (SOFTGOODS) IMPLANT
SPLINT WRIST M RT TX990303 (SOFTGOODS) IMPLANT
SPLINT WRIST XL LT TX990310 (SOFTGOODS) IMPLANT
SPLINT WRIST XL RT TX990305 (SOFTGOODS) IMPLANT
STOCKINETTE IMPERVIOUS 9X36 MD (GAUZE/BANDAGES/DRESSINGS) ×1 IMPLANT
SUT PROLENE 4 0 PS 2 18 (SUTURE) ×1 IMPLANT
TRAP FLUID SMOKE EVACUATOR (MISCELLANEOUS) ×1 IMPLANT
WATER STERILE IRR 500ML POUR (IV SOLUTION) ×1 IMPLANT

## 2022-07-09 NOTE — Transfer of Care (Signed)
Immediate Anesthesia Transfer of Care Note  Patient: Cynthia Ritter  Procedure(s) Performed: CARPAL TUNNEL RELEASE ENDOSCOPIC (Left: Wrist)  Patient Location: PACU  Anesthesia Type:General  Level of Consciousness: drowsy  Airway & Oxygen Therapy: Patient Spontanous Breathing  Post-op Assessment: Report given to RN  Post vital signs: stable  Last Vitals:  Vitals Value Taken Time  BP    Temp    Pulse 72 07/09/22 1244  Resp 13 07/09/22 1244  SpO2 96 % 07/09/22 1244  Vitals shown include unvalidated device data.  Last Pain:  Vitals:   07/09/22 1050  TempSrc: Tympanic  PainSc: 0-No pain         Complications: No notable events documented.

## 2022-07-09 NOTE — H&P (Signed)
History of Present Illness:  Cynthia Ritter is a 89 y.o. female who presents for evaluation and treatment of her persistent left hand/wrist pain and paresthesias. The patient had undergone an endoscopic right carpal tunnel release for EMG confirmed right carpal tunnel syndrome in February, 2022, from which she has done quite well. At the time, the EMG also confirmed the presence of left carpal tunnel syndrome. However, the patient felt that her symptoms were manageable and did not wish to pursue further intervention for her left wrist at that time. Over the past few months, her left wrist/hand symptoms have worsened. She has pain and tingling originating in her wrist which extends to her thumb distally as well as proximally into her forearm. The numbness will then spread to the index and long fingers. The symptoms occur primarily at night, frequently awakening her from sleep. However, she also can experience the symptoms during the day while performing more repetitive or sustained activities. She denies any recent injury to the wrist ready to consider more aggressive treatment options.  Current Outpatient Medications: acetaminophen (TYLENOL) 650 MG ER tablet Take 650 mg by mouth every 8 (eight) hours as needed for Pain  alendronate (FOSAMAX) 70 MG tablet Take 1 tablet (70 mg total) by mouth every 7 (seven) days Take with a full glass of water. Do not lie down for the next 30 min. 12 tablet 1  amoxicillin (AMOXIL) 500 MG capsule Take 4 tablets 1 hour prior to dental procedure. 4 capsule 3  aspirin-sod bicarb-citric acid (ALKA-SELTZER) 324 mg TbEF tablet Take by mouth as needed  cyanocobalamin (VITAMIN B12) 1000 MCG tablet Take 1,000 mcg by mouth once daily  diclofenac (VOLTAREN) 1 % topical gel Apply topically 4 (four) times daily as needed 100 g 5  diclofenac (VOLTAREN) 75 MG EC tablet Take 1 tablet (75 mg total) by mouth 2 (two) times daily with meals 180 tablet 1  FLUoxetine (PROZAC) 10 MG capsule Take 1  capsule (10 mg total) by mouth once daily 90 capsule 1  FLUoxetine (PROZAC) 20 MG capsule Take 1 capsule (20 mg total) by mouth once daily 90 capsule 1  losartan (COZAAR) 25 MG tablet Take 1 tablet (25 mg total) by mouth once daily 90 tablet 1  mirabegron (MYRBETRIQ) 25 mg ER Tablet Take 1 tablet (25 mg total) by mouth once daily for 180 days 90 tablet 1  tolterodine (DETROL LA) 4 MG LA capsule Take 1 capsule (4 mg total) by mouth once daily 90 capsule 1    Allergies: No Known Allergies  Past Medical History:   Allergic rhinitis  Arthritis  Bilateral hand numbness 03/08/2020  Carpal tunnel syndrome on both sides 02/10/2020  Cataract cortical, senile  Closed fracture of left hip with routine healing 04/16/2020  left femoral neck fracture, s/p pinning  Colon polyps  COVID-19 11/10/2020  Depression  Essential hypertension 09/14/2017  Femoral neck fracture (CMS-HCC) 04/16/2020  Hyperlipidemia  Insomnia  Lipoma of left lower extremity 02/10/2020  Osteopenia 06/12/2020  FRAX: 17% 10 year risk of hip fracture. 30% 10 year risk of any major fracture.  Primary osteoarthritis of right knee 01/28/2017  Primary osteoarthritis of right knee 01/28/2017  Squamous cell cancer of multiple sites of skin of upper arm  Followed by Dr. Phillip Heal  Trigger index finger of right hand 02/10/2020  Urinary incontinence, mixed   Past Surgical History:  CHOLECYSTECTOMY 1991  ENDOMETRIAL BIOPSY 2003 (Negative)  CATARACT EXTRACTION Bilateral 2017 (Dr. Wallace Going)  BIOPSY SKIN [place] 2018 (+Squamous cell of  nose)  EXTRACTION TEETH 12/2017  Cannulated Hip Pinning Left 04/18/2020  Endoscopic right carpal tunnel release Right 06/13/2020 (Dr. Roland Rack)  Removal of painful retained screws, left hip 06/25/2021 (Dr. Roland Rack)  Left total hip arthroplasty 01/09/2022 (Dr. Roland Rack)  [place] (Multiple biopsies)  COLONOSCOPY x 2   Family History:  Liver disease Sister  Non-alcoholic  Ulcers Sister  Heart failure  Mother  Heart failure Father   Social History:   Socioeconomic History:  Marital status: Widowed  Spouse name: Reynolds Bowl"  Number of children: 2  Years of education: 24  Occupational History  Occupation: Retired Pharmacist, hospital  Tobacco Use  Smoking status: Former  Types: Cigarettes  Smokeless tobacco: Never  Tobacco comments:  smoked in [employer] Use: Never used  Substance and Sexual Activity  Alcohol use: Yes  Comment: very little. Wine or Liquor.  Drug use: No  Sexual activity: Not Currently  Partners: Male  Birth control/protection: Post-menopausal  Social History Narrative  Son, Legrand Como, is living w/ them now.   Review of Systems:  A comprehensive 14 point ROS was performed, reviewed, and the pertinent orthopaedic findings are documented in the HPI.  Physical Exam: Vitals:  06/18/22 1048  BP: (!) 134/94  Weight: 87.1 kg (192 lb)  Height: 160 cm ('5\' 3"'$ )  PainSc: 0-No pain  PainLoc: Hand   General/Constitutional: The patient appears to be well-nourished, well-developed, and in no acute distress. Neuro/Psych: Normal mood and affect, oriented to person, place and time. Eyes: Non-icteric. Pupils are equal, round, and reactive to light, and exhibit synchronous movement. ENT: Unremarkable. Lymphatic: No palpable adenopathy. Respiratory: Lungs clear to auscultation, Normal chest excursion, No wheezes, and Non-labored breathing Cardiovascular: Regular rate and rhythm. No murmurs. and No edema, swelling or tenderness, except as noted in detailed exam. Integumentary: No impressive skin lesions present, except as noted in detailed exam. Musculoskeletal: Unremarkable, except as noted in detailed exam.  Left hand/wrist exam: Skin inspection of the left hand and wrist is unremarkable. No swelling, erythema, ecchymosis, abrasions, or other skin abnormalities are identified. There is no tenderness to palpation of the dorsal or palmar aspects of the hand and wrist.  She exhibits full active and passive range of motion of the wrist without any pain or catching. She is able to actively flex and extend all digits fully without any pain or triggering. She is neurovascularly intact to all digits. She has a negative Phalen's test and a negative Tinel's 9 over the carpal tunnel.  Assessment: Carpal tunnel syndrome, left.   Plan: The treatment options were discussed with the patient. In addition, patient educational materials were provided regarding the diagnosis and treatment options. It is quite frustrated by her worsening symptoms and functional limitations, and is ready to consider more aggressive treatment options. Therefore, I have recommended a surgical procedure, specifically an endoscopic left carpal tunnel release. The procedure was discussed with the patient, as were the potential risks (including bleeding, infection, nerve and/or blood vessel injury, persistent or recurrent pain/paresthesias, weakness of grip, need for further surgery, blood clots, strokes, heart attacks and/or arhythmias, pneumonia, etc.) and benefits. The patient states his/her understanding and wishes to proceed. All of the patient's questions and concerns were answered. She can call any time with further concerns. Meanwhile, the patient may continue with her normal daily activities but is to avoid offending activities. To facilitate this, the patient is fitted with a Velcro wrist immobilizer to wear at night, as well as during the day as  needed to help manage her symptoms better. She may also take over-the-counter medications as needed for discomfort. She will follow up post-surgery, routine.    H&P reviewed and patient re-examined. No changes.

## 2022-07-09 NOTE — Op Note (Signed)
07/09/2022  12:46 PM  Patient:   Cynthia Ritter  Pre-Op Diagnosis:   Left carpal tunnel syndrome.  Post-Op Diagnosis:   Same.  Procedure:   Endoscopic left carpal tunnel release.  Surgeon:   Pascal Lux, MD  Anesthesia:   Local with IV sedation  Findings:   As above.  Complications:   None  EBL:   0 cc  Fluids:   300 cc crystalloid  TT:   13 minutes at 250 mmHg  Drains:   None  Closure:   4-0 Prolene interrupted sutures  Brief Clinical Note:   The patient is an 89 year old female with a history of progressively worsening pain and paresthesias to her left hand. Her symptoms have progressed despite medications, activity modification, splinting, etc. Her history and examination are consistent with carpal tunnel syndrome. She is status post a prior endoscopic right carpal tunnel release. She presents at this time for an endoscopic left carpal tunnel release.   Procedure:   The patient was brought into the operating room and lain in the supine position. After adequate IV sedation was obtained, the left hand and upper extremity were prepped with ChloraPrep solution before being draped sterilely. Preoperative antibiotics were administered. A timeout was performed to verify the appropriate surgical site before the limb was exsanguinated with an Esmarch and the tourniquet inflated to 250 mmHg.   Prior to making an incision, the surgical site was injected sterilely using 10 cc of 0.5% plain Sensorcaine. An approximately 1.5-2 cm incision was made over the volar wrist flexion crease, centered over the palmaris longus tendon. The incision was carried down through the subcutaneous tissues with care taken to identify and protect any neurovascular structures. The distal forearm fascia was penetrated just proximal to the transverse carpal ligament. The soft tissues were released off the superficial and deep surfaces of the distal forearm fascia and this was released proximally for 3-4 cm under  direct visualization.  Attention was directed distally. The Soil scientist was passed beneath the transverse carpal ligament along the ulnar aspect of the carpal tunnel and used to release any adhesions as well as to remove any adherent synovial tissue before first the smaller then the larger of the two dilators were passed beneath the transverse carpal ligament along the ulnar margin of the carpal tunnel. The slotted cannula was introduced and the endoscope was placed into the slotted cannula and the undersurface of the transverse carpal ligament visualized. The distal margin of the transverse carpal ligament was marked by placing a 25-gauge needle percutaneously at [place] cardinal point so that it entered the distal portion of the slotted cannula. Under endoscopic visualization, the transverse carpal ligament was released from proximal to distal using the end-cutting blade. A second pass was performed to ensure complete release of the ligament. The adequacy of release was verified both endoscopically and by palpation using the freer elevator.  The wound was irrigated thoroughly with sterile saline solution before being closed using 4-0 Prolene interrupted sutures. A sterile bulky dressing was applied to the wound. The patient was placed into a volar wrist splint before being awakened and returned to the recovery room in satisfactory condition after tolerating the procedure well.

## 2022-07-09 NOTE — Discharge Instructions (Addendum)
Orthopedic discharge instructions: Keep dressing dry and intact. Keep hand elevated above heart level. May shower after dressing removed on postop day 4 (Sunday). Cover sutures with Band-Aids after drying off, then reapply Velcro splint. Apply ice to affected area frequently. Take diclofenac 50 or 75 mg BID with meals for 3-5 days, then as necessary. Take ES Tylenol as needed.  Return for follow-up in 10-14 days or as scheduled.   AMBULATORY SURGERY  DISCHARGE INSTRUCTIONS   The drugs that you were given will stay in your system until tomorrow so for the next 24 hours you should not:  Drive an automobile Make any legal decisions Drink any alcoholic beverage   You may resume regular meals tomorrow.  Today it is better to start with liquids and gradually work up to solid foods.  You may eat anything you prefer, but it is better to start with liquids, then soup and crackers, and gradually work up to solid foods.   Please notify your doctor immediately if you have any unusual bleeding, trouble breathing, redness and pain at the surgery site, drainage, fever, or pain not relieved by medication.    Additional Instructions:        Please contact your physician with any problems or Same Day Surgery at [phone], Monday through Friday 6 am to 4 pm, or [place] at [facility] number at [phone].

## 2022-07-09 NOTE — Anesthesia Preprocedure Evaluation (Signed)
Anesthesia Evaluation  Patient identified by MRN, date of birth, ID band Patient awake    Reviewed: Allergy & Precautions, H&P , NPO status , Patient's Chart, lab work & pertinent test results, reviewed documented beta blocker date and time   Airway Mallampati: II  TM Distance: >3 FB Neck ROM: full    Dental  (+) Teeth Intact   Pulmonary neg pulmonary ROS, former smoker   Pulmonary exam normal        Cardiovascular Exercise Tolerance: Poor hypertension, On Medications Normal cardiovascular exam+ Valvular Problems/Murmurs  Rate:Normal     Neuro/Psych  PSYCHIATRIC DISORDERS  Depression     Neuromuscular disease    GI/Hepatic Neg liver ROS,GERD  Medicated,,  Endo/Other  negative endocrine ROS    Renal/GU negative Renal ROS  negative genitourinary   Musculoskeletal   Abdominal   Peds  Hematology negative hematology ROS (+)   Anesthesia Other Findings   Reproductive/Obstetrics negative OB ROS                             Anesthesia Physical Anesthesia Plan  ASA: 3  Anesthesia Plan: General   Post-op Pain Management:    Induction:   PONV Risk Score and Plan: 4 or greater  Airway Management Planned:   Additional Equipment:   Intra-op Plan:   Post-operative Plan:   Informed Consent: I have reviewed the patients History and Physical, chart, labs and discussed the procedure including the risks, benefits and alternatives for the proposed anesthesia with the patient or authorized representative who has indicated his/her understanding and acceptance.       Plan Discussed with: CRNA  Anesthesia Plan Comments:        Anesthesia Quick Evaluation

## 2022-07-10 ENCOUNTER — Encounter: Payer: Self-pay | Admitting: Surgery

## 2022-07-10 NOTE — Anesthesia Postprocedure Evaluation (Signed)
Anesthesia Post Note  Patient: Cynthia Ritter  Procedure(s) Performed: CARPAL TUNNEL RELEASE ENDOSCOPIC (Left: Wrist)  Patient location during evaluation: PACU Anesthesia Type: General Level of consciousness: awake and alert Pain management: pain level controlled Vital Signs Assessment: post-procedure vital signs reviewed and stable Respiratory status: spontaneous breathing, nonlabored ventilation, respiratory function stable and patient connected to nasal cannula oxygen Cardiovascular status: blood pressure returned to baseline and stable Postop Assessment: no apparent nausea or vomiting Anesthetic complications: no   No notable events documented.   Last Vitals:  Vitals:   07/09/22 1306 07/09/22 1313  BP:  (!) 176/79  Pulse: 68 63  Resp: 19 17  Temp: 37.1 C 36.8 C  SpO2: 100% 97%    Last Pain:  Vitals:   07/09/22 1313  TempSrc: Temporal  PainSc: 0-No pain                 Molli Barrows

## 2022-08-27 ENCOUNTER — Ambulatory Visit
Admission: RE | Admit: 2022-08-27 | Discharge: 2022-08-27 | Disposition: A | Discharge status: Home / Self Care | Payer: Medicare Other | Source: Ambulatory Visit | Attending: Family Medicine | Admitting: Family Medicine

## 2022-08-27 VITALS — BP 176/93 | HR 62 | Temp 98.5°F | Ht 64.0 in | Wt 194.0 lb

## 2022-08-27 DIAGNOSIS — H6121 Impacted cerumen, right ear: Secondary | ICD-10-CM

## 2022-08-27 NOTE — Discharge Instructions (Signed)
Stop by the pharmacy to pick up Debrox for earwax removal.     

## 2022-08-27 NOTE — ED Provider Notes (Signed)
[facility]    CSN: [id number] Arrival date & time: 08/27/22  1807      History   Chief Complaint Chief Complaint  Patient presents with   Ear Fullness    HPI RAYNAH GOMES is a 89 y.o. female.   HPI   Madisin presents for ear cleaning. Feels like her right ear is clogged. Her hearing aid keeps falling out.  Denies ear pain.  Aneisha has otherwise been well and has no other concerns today.      Past Medical History:  Diagnosis Date   Arthritis    left heel/ knees and ankle, wears a knee brace occasionally   Carpal tunnel syndrome    Cataract cortical, senile    Closed displaced fracture of left femoral neck (HCC) 04/16/2020   COVID-19 11/20/2020   Depression    Femoral neck fracture (HCC)    s/p repair   GERD (gastroesophageal reflux disease)    Heart murmur    Hot flashes    Hyperlipemia    Hypertension    Incontinence    URINARY   Lipoma of left lower extremity    Motion sickness    planes, back seat-cars   Osteoarthritis of right knee    Osteopenia    Seasonal allergies    Skin cancer    nose, arm    Patient Active Problem List   Diagnosis Date Noted   Status post total hip replacement, 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   Femoral neck fracture (HCC) 04/16/2020   Hypertension    Depression    Incontinence     Past Surgical History:  Procedure Laterality Date   CARPAL TUNNEL RELEASE Right 06/13/2020   Procedure: CARPAL TUNNEL RELEASE ENDOSCOPIC;  Surgeon: Christena Flake, MD;  Location: [facility];  Service: Orthopedics;  Laterality: Right;   CARPAL TUNNEL RELEASE Left 07/09/2022   Procedure: CARPAL TUNNEL RELEASE ENDOSCOPIC;  Surgeon: Christena Flake, MD;  Location: [facility];  Service: Orthopedics;  Laterality: Left;   CATARACT EXTRACTION W/PHACO Right 09/26/2015   Procedure: CATARACT EXTRACTION PHACO AND INTRAOCULAR LENS PLACEMENT (IOC);  Surgeon: Lockie Mola, MD;  Location: [facility];  Service: Ophthalmology;  Laterality: Right;   CATARACT  EXTRACTION W/PHACO Left 10/24/2015   Procedure: CATARACT EXTRACTION PHACO AND INTRAOCULAR LENS PLACEMENT (IOC) left eye;  Surgeon: Lockie Mola, MD;  Location: [facility];  Service: Ophthalmology;  Laterality: Left;   CHOLECYSTECTOMY  1991   COLONOSCOPY     X2   HARDWARE REMOVAL Left 06/25/2021   Procedure: LEFT HIP HARDWARE REMOVAL;  Surgeon: Christena Flake, MD;  Location: [facility];  Service: Orthopedics;  Laterality: Left;   HIP PINNING,CANNULATED Left 04/18/2020   Procedure: CANNULATED HIP PINNING;  Surgeon: Signa Kell, MD;  Location: [facility];  Service: Orthopedics;  Laterality: Left;   TOTAL HIP ARTHROPLASTY Left 01/09/2022   Procedure: TOTAL HIP ARTHROPLASTY;  Surgeon: Christena Flake, MD;  Location: [facility];  Service: Orthopedics;  Laterality: Left;    OB History   No obstetric history on file.      Home Medications    Prior to Admission medications   Medication Sig Start Date End Date Taking? Authorizing Provider  alendronate (FOSAMAX) 70 MG tablet Take 70 mg by mouth once a week. Thursday 06/14/20  Yes [provider]  aspirin-sod bicarb-citric acid (ALKA-SELTZER) 325 MG TBEF tablet Take 325 mg by mouth every 6 (six) hours as needed (indigestion).   Yes [provider]  diclofenac (VOLTAREN) 75 MG EC  tablet Take 75 mg by mouth 2 (two) times daily.   Yes [provider]  diclofenac Sodium (VOLTAREN) 1 % GEL Apply 2 g topically 4 (four) times daily as needed.   Yes [provider]  famotidine (PEPCID) 20 MG tablet Take 20 mg by mouth daily as needed for heartburn or indigestion.   Yes [provider]  FLUoxetine (PROZAC) 20 MG capsule Take 20 mg by mouth daily.   Yes [provider]  losartan (COZAAR) 25 MG tablet Take 25 mg by mouth every morning. 01/14/20  Yes [provider]  MYRBETRIQ 25 MG TB24 tablet Take 25 mg by mouth every morning. 01/15/20  Yes [provider]  oxymetazoline (AFRIN)  0.05 % nasal spray Place 1 spray into both nostrils at bedtime as needed for congestion.   Yes [provider]  tolterodine (DETROL LA) 4 MG 24 hr capsule Take 4 mg by mouth every morning.   Yes [provider]  vitamin B-12 (CYANOCOBALAMIN) 1000 MCG tablet Take 1,000 mcg by mouth every other day.   Yes [provider]  amoxicillin (AMOXIL) 500 MG capsule Take 2,000 mg by mouth once. 1 hour prior to dental appointments    [provider]    Family History Family History  Problem Relation Age of Onset   Heart failure Mother    Hypertension Mother    Asthma Mother    Parkinson's disease Mother    Diabetes Mother    Heart failure Father    Cirrhosis Sister     Social History Social History   Tobacco Use   Smoking status: Former   Smokeless tobacco: Never   Tobacco comments:    smoked in college  Vaping Use   Vaping Use: Never used  Substance Use Topics   Alcohol use: Yes    Comment: may drink 1x/month   Drug use: Never     Allergies   Patient has no known allergies.   Review of Systems Review of Systems: :negative unless otherwise stated in HPI.      Physical Exam Triage Vital Signs ED Triage Vitals  Enc Vitals Group     BP 08/27/22 1830 (!) 176/93     Pulse Rate 08/27/22 1830 62     Resp --      Temp 08/27/22 1830 98.5 F (36.9 C)     Temp Source 08/27/22 1830 Oral     SpO2 08/27/22 1830 95 %     Weight 08/27/22 1825 194 lb (88 kg)     Height 08/27/22 1825 5\' 4"  (1.626 m)     Head Circumference --      Peak Flow --      Pain Score 08/27/22 1824 0     Pain Loc --      Pain Edu? --      Excl. in GC? --    No data found.  Updated Vital Signs BP (!) 176/93 (BP Location: Left Arm)   Pulse 62   Temp 98.5 F (36.9 C) (Oral)   Ht 5\' 4"  (1.626 m)   Wt 88 kg   SpO2 95%   BMI 33.30 kg/m   Visual Acuity Right Eye Distance:   Left Eye Distance:   Bilateral Distance:    Right Eye Near:   Left Eye Near:     Bilateral Near:     Physical Exam GEN:     alert, well appearing female in no distress    HENT:  mucus membranes  moist, no nasal discharge, right TM not visible due to cerumen, left TM normal, normal external auditory canals bilaterally, nontender tragus   EYES:   no scleral injection NECK:  ROM at baseline RESP:  no increased work of breathing CVS:   regular rate  Skin:   warm and dry    UC Treatments / Results  Labs (all labs ordered are listed, but only abnormal results are displayed) Labs Reviewed - No data to display  EKG   Radiology No results found.  Procedures Procedures (including critical care time) Procedures Ear Cerumen Removal   Date/Time: 04/18/2019 9:08 PM Performed by: nursing staff Authorized by: Katha Cabal, DO   Consent:    Consent obtained:  Verbal   Consent given by:  Patient   Risks discussed:  Bleeding, dizziness, infection, incomplete removal, TM perforation and pain   Alternatives discussed:  No treatment Procedure details:    Location: Right ear   Procedure type: irrigation   Post-procedure details:    Inspection:  TM intact   Hearing quality:  Improved   Patient tolerance of procedure:  Tolerated well, no immediate complications  Medications Ordered in UC Medications - No data to display  Initial Impression / Assessment and Plan / UC Course  I have reviewed the triage vital signs and the nursing notes.  Pertinent labs & imaging results that were available during my care of the patient were reviewed by me and considered in my medical decision making (see chart for details).      Overall patient is well-appearing, well-hydrated and without respiratory distress. [place]ala is afebrile. No evidence of acute otitis media or acute otitis externa. Ceruminosis is noted on the right. I suspect this in part why she is having acute hearing difficulty.  Wax is removed by syringing debridement. Instructions for home care to prevent wax buildup  are given.   Discussed MDM, treatment plan and plan for follow-up with patient who agrees with plan.   Final Clinical Impressions(s) / UC Diagnoses   Final diagnoses:  Hearing loss of right ear due to cerumen impaction     Discharge Instructions      Stop by the pharmacy to pick up Debrox for earwax removal.        ED Prescriptions   None    PDMP not reviewed this encounter.   Katha Cabal, DO 09/03/22 0203

## 2022-08-27 NOTE — ED Triage Notes (Signed)
Pt is here for her ears to be cleaned.   Pt states that her PCP in january told her her ears needed to be cleaned but did not clean her ears.  Pt's hearing aids are falling out of her ears due to wax buildup.

## 2023-08-03 ENCOUNTER — Ambulatory Visit
Admission: EM | Admit: 2023-08-03 | Discharge: 2023-08-03 | Disposition: A | Discharge status: Home / Self Care | Attending: Emergency Medicine | Admitting: Emergency Medicine

## 2023-08-03 DIAGNOSIS — M7632 Iliotibial band syndrome, left leg: Secondary | ICD-10-CM

## 2023-08-03 MED ORDER — BACLOFEN 5 MG PO TABS: 5.0000 mg | ORAL_TABLET | Freq: Three times a day (TID) | ORAL | 0 refills | Status: AC | PRN

## 2023-08-03 NOTE — Discharge Instructions (Addendum)
 Continue to apply 4 g of Voltaren gel to your left hip and thigh every 6 hours as needed for pain and inflammation.  Start taking the 5 mg baclofen tablets every 8 hours to help with muscle pain and tension.  Follow the home physical therapy exercises in your discharge instructions to help loosen up the IT band and aid in pain relief.  You may apply ice to your left IT band and hip for 20 minutes at a time, 2-3 times a day, to help with pain and inflammation.  Whether you are pain improves or not, I do want you to make a follow-up appointment with [facility] orthopedics for evaluation.  If your pain does not improve you may need designated physical therapy or a different treatment modality that we are unable to provide here at urgent care.

## 2023-08-03 NOTE — ED Triage Notes (Signed)
 Patient presents to UC for left sided hip pain x 3 days ago. Has been gardening believes the bending may have caused the hip pain. Hip replacement sept 2023. Treating pain with Voltaren gel and 75 mg diclofenac with some relief.   Denies injury, numbness or tingling.

## 2023-08-03 NOTE — ED Provider Notes (Signed)
 [facility]    CSN: [id number] Arrival date & time: 08/03/23  0950      History   Chief Complaint Chief Complaint  Patient presents with   Hip Pain    HPI Cynthia Ritter is a 89 y.o. female.   HPI  89 year old female with past medical history significant for GERD, depression, cataracts, carpal tunnel, arthritis, and status post left hip replacement presents for evaluation of pain in her left hip and thigh that started 3 days ago.  She reports that she has been doing a lot of gardening and has been bending over to pull weeds.  She denies any injuries, falls, or other trauma.  She has been using Voltaren gel with some improvement of pain.  She is a patient of [facility] and attempted to see called the clinic today but there was no provider available this week.  Past Medical History:  Diagnosis Date   Arthritis    left heel/ knees and ankle, wears a knee brace occasionally   Carpal tunnel syndrome    Cataract cortical, senile    Closed displaced fracture of left femoral neck (HCC) 04/16/2020   COVID-19 11/20/2020   Depression    Femoral neck fracture (HCC)    s/p repair   GERD (gastroesophageal reflux disease)    Heart murmur    Hot flashes    Hyperlipemia    Hypertension    Incontinence    URINARY   Lipoma of left lower extremity    Motion sickness    planes, back seat-cars   Osteoarthritis of right knee    Osteopenia    Seasonal allergies    Skin cancer    nose, arm    Patient Active Problem List   Diagnosis Date Noted   Status post total hip replacement, 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   Femoral neck fracture (HCC) 04/16/2020   Hypertension    Depression    Incontinence     Past Surgical History:  Procedure Laterality Date   CARPAL TUNNEL RELEASE Right 06/13/2020   Procedure: CARPAL TUNNEL RELEASE ENDOSCOPIC;  Surgeon: Christena Flake, MD;  Location: [facility];  Service: Orthopedics;  Laterality: Right;   CARPAL TUNNEL RELEASE Left 07/09/2022   Procedure:  CARPAL TUNNEL RELEASE ENDOSCOPIC;  Surgeon: Christena Flake, MD;  Location: [facility];  Service: Orthopedics;  Laterality: Left;   CATARACT EXTRACTION W/PHACO Right 09/26/2015   Procedure: CATARACT EXTRACTION PHACO AND INTRAOCULAR LENS PLACEMENT (IOC);  Surgeon: Lockie Mola, MD;  Location: [facility];  Service: Ophthalmology;  Laterality: Right;   CATARACT EXTRACTION W/PHACO Left 10/24/2015   Procedure: CATARACT EXTRACTION PHACO AND INTRAOCULAR LENS PLACEMENT (IOC) left eye;  Surgeon: Lockie Mola, MD;  Location: [facility];  Service: Ophthalmology;  Laterality: Left;   CHOLECYSTECTOMY  1991   COLONOSCOPY     X2   HARDWARE REMOVAL Left 06/25/2021   Procedure: LEFT HIP HARDWARE REMOVAL;  Surgeon: Christena Flake, MD;  Location: [facility];  Service: Orthopedics;  Laterality: Left;   HIP PINNING,CANNULATED Left 04/18/2020   Procedure: CANNULATED HIP PINNING;  Surgeon: Signa Kell, MD;  Location: [facility];  Service: Orthopedics;  Laterality: Left;   TOTAL HIP ARTHROPLASTY Left 01/09/2022   Procedure: TOTAL HIP ARTHROPLASTY;  Surgeon: Christena Flake, MD;  Location: [facility];  Service: Orthopedics;  Laterality: Left;    OB History   No obstetric history on file.      Home Medications    Prior to Admission medications   Medication  Sig Start Date End Date Taking? Authorizing Provider  Baclofen 5 MG TABS Take 1 tablet (5 mg total) by mouth 3 (three) times daily as needed. 08/03/23  Yes Becky Augusta, NP  alendronate (FOSAMAX) 70 MG tablet Take 70 mg by mouth once a week. Thursday 06/14/20   [provider]  amoxicillin (AMOXIL) 500 MG capsule Take 2,000 mg by mouth once. 1 hour prior to dental appointments    [provider]  aspirin-sod bicarb-citric acid (ALKA-SELTZER) 325 MG TBEF tablet Take 325 mg by mouth every 6 (six) hours as needed (indigestion).    [provider]  diclofenac (VOLTAREN) 75 MG EC tablet Take 75 mg by mouth 2 (two) times  daily.    [provider]  diclofenac Sodium (VOLTAREN) 1 % GEL Apply 2 g topically 4 (four) times daily as needed.    [provider]  famotidine (PEPCID) 20 MG tablet Take 20 mg by mouth daily as needed for heartburn or indigestion.    [provider]  FLUoxetine (PROZAC) 20 MG capsule Take 20 mg by mouth daily.    [provider]  losartan (COZAAR) 25 MG tablet Take 25 mg by mouth every morning. 01/14/20   [provider]  MYRBETRIQ 25 MG TB24 tablet Take 25 mg by mouth every morning. 01/15/20   [provider]  oxymetazoline (AFRIN) 0.05 % nasal spray Place 1 spray into both nostrils at bedtime as needed for congestion.    [provider]  tolterodine (DETROL LA) 4 MG 24 hr capsule Take 4 mg by mouth every morning.    [provider]  vitamin B-12 (CYANOCOBALAMIN) 1000 MCG tablet Take 1,000 mcg by mouth every other day.    [provider]    Family History Family History  Problem Relation Age of Onset   Heart failure Mother    Hypertension Mother    Asthma Mother    Parkinson's disease Mother    Diabetes Mother    Heart failure Father    Cirrhosis Sister     Social History Social History   Tobacco Use   Smoking status: Former   Smokeless tobacco: Never   Tobacco comments:    smoked in college  Vaping Use   Vaping status: Never Used  Substance Use Topics   Alcohol use: Yes    Comment: may drink 1x/month   Drug use: Never     Allergies   Patient has no known allergies.   Review of Systems Review of Systems  Constitutional:  Negative for fever.  Musculoskeletal:  Positive for arthralgias. Negative for joint swelling.     Physical Exam Triage Vital Signs ED Triage Vitals  Encounter Vitals Group     BP 08/03/23 1013 (!) 156/78     Systolic BP Percentile --      Diastolic BP Percentile --      Pulse Rate 08/03/23 1013 69     Resp --      Temp 08/03/23 1013 98.9 F (37.2 C)      Temp Source 08/03/23 1013 Oral     SpO2 08/03/23 1013 95 %     Weight --      Height --      Head Circumference --      Peak Flow --      Pain Score 08/03/23 1012 5     Pain Loc --      Pain Education --      Exclude from Growth Chart --  No data found.  Updated Vital Signs BP (!) 156/78 (BP Location: Left Arm)   Pulse 69   Temp 98.9 F (37.2 C) (Oral)   SpO2 95%   Visual Acuity Right Eye Distance:   Left Eye Distance:   Bilateral Distance:    Right Eye Near:   Left Eye Near:    Bilateral Near:     Physical Exam Vitals and nursing note reviewed.  Constitutional:      Appearance: Normal appearance. She is not ill-appearing.  Musculoskeletal:        General: Tenderness present. No signs of injury.  Skin:    General: Skin is warm and dry.     Capillary Refill: Capillary refill takes less than 2 seconds.  Neurological:     General: No focal deficit present.     Mental Status: She is alert and oriented to person, place, and time.      UC Treatments / Results  Labs (all labs ordered are listed, but only abnormal results are displayed) Labs Reviewed - No data to display  EKG   Radiology No results found.  Procedures Procedures (including critical care time)  Medications Ordered in UC Medications - No data to display  Initial Impression / Assessment and Plan / UC Course  I have reviewed the triage vital signs and the nursing notes.  Pertinent labs & imaging results that were available during my care of the patient were reviewed by me and considered in my medical decision making (see chart for details).   Patient is a pleasant, nontoxic-appearing 89 year old female pending for evaluation of pain in her IT band on the left side as outlined HPI above.  She is currently in a wheelchair in the exam room and she reports that the pain has been going on for last 3 days.  2 days ago it kept her up and she was unable to sleep because it was difficult to lay down as  that increased her pain.  She has been using topical Voltaren gel which has been helping the situation.  She also takes diclofenac twice daily by mouth for her arthritis.  The patient is indicating pain is along the path of her IT band and her IT band is tender to palpation.  She also some mild tenderness in the left gluteus maximus muscle without overt spasm.  I suspect that she has inflamed her IT band by bending over to pull weeds in her garden.  I will have her continue the Voltaren gel, I will prescribe 5 mg of baclofen to help with muscle pain that she can take 3 times a day, and also have her perform home physical therapy exercises.  I will also have her follow-up with orthopedics at [facility] if her pain does not improve as she may need designated physical therapy.   Final Clinical Impressions(s) / UC Diagnoses   Final diagnoses:  It band syndrome, left     Discharge Instructions      Continue to apply 4 g of Voltaren gel to your left hip and thigh every 6 hours as needed for pain and inflammation.  Start taking the 5 mg baclofen tablets every 8 hours to help with muscle pain and tension.  Follow the home physical therapy exercises in your discharge instructions to help loosen up the IT band and aid in pain relief.  You may apply ice to your left IT band and hip for 20 minutes at a time, 2-3 times a day, to help  with pain and inflammation.  Whether you are pain improves or not, I do want you to make a follow-up appointment with [facility] orthopedics for evaluation.  If your pain does not improve you may need designated physical therapy or a different treatment modality that we are unable to provide here at urgent care.     ED Prescriptions     Medication Sig Dispense Auth. Provider   Baclofen 5 MG TABS Take 1 tablet (5 mg total) by mouth 3 (three) times daily as needed. 30 tablet Becky Augusta, NP      PDMP not reviewed this encounter.   Becky Augusta,  NP 08/03/23 1046
